# Patient Record
Sex: Male | Born: 1970
Health system: Southern US, Community
[De-identification: ages and names within clinical notes are randomized; demographics above are authoritative.]

## PROBLEM LIST (undated history)

## (undated) DIAGNOSIS — M25429 Effusion, unspecified elbow: Secondary | ICD-10-CM

## (undated) DIAGNOSIS — E785 Hyperlipidemia, unspecified: Secondary | ICD-10-CM

## (undated) DIAGNOSIS — I10 Essential (primary) hypertension: Secondary | ICD-10-CM

## (undated) DIAGNOSIS — G43909 Migraine, unspecified, not intractable, without status migrainosus: Secondary | ICD-10-CM

## (undated) DIAGNOSIS — R51 Headache: Secondary | ICD-10-CM

## (undated) DIAGNOSIS — M109 Gout, unspecified: Secondary | ICD-10-CM

## (undated) DIAGNOSIS — R519 Headache, unspecified: Secondary | ICD-10-CM

## (undated) HISTORY — DX: Headache, unspecified: R51.9

## (undated) HISTORY — DX: Hyperlipidemia, unspecified: E78.5

## (undated) HISTORY — DX: Headache: R51

## (undated) HISTORY — DX: Effusion, unspecified elbow: M25.429

## (undated) HISTORY — PX: TESTICLE TORSION REDUCTION: SHX795

## (undated) HISTORY — DX: Gout, unspecified: M10.9

## (undated) HISTORY — DX: Migraine, unspecified, not intractable, without status migrainosus: G43.909

---

## 2000-06-19 ENCOUNTER — Emergency Department (HOSPITAL_COMMUNITY): Admission: EM | Admit: 2000-06-19 | Discharge: 2000-06-19 | Payer: Self-pay | Admitting: Emergency Medicine

## 2011-12-22 ENCOUNTER — Emergency Department (HOSPITAL_BASED_OUTPATIENT_CLINIC_OR_DEPARTMENT_OTHER)
Admission: EM | Admit: 2011-12-22 | Discharge: 2011-12-22 | Disposition: A | Discharge status: Home / Self Care | Payer: BC Managed Care – PPO | Attending: Emergency Medicine | Admitting: Emergency Medicine

## 2011-12-22 ENCOUNTER — Encounter (HOSPITAL_BASED_OUTPATIENT_CLINIC_OR_DEPARTMENT_OTHER): Payer: Self-pay

## 2011-12-22 ENCOUNTER — Emergency Department (INDEPENDENT_AMBULATORY_CARE_PROVIDER_SITE_OTHER): Payer: BC Managed Care – PPO

## 2011-12-22 ENCOUNTER — Other Ambulatory Visit: Payer: Self-pay

## 2011-12-22 DIAGNOSIS — I10 Essential (primary) hypertension: Secondary | ICD-10-CM

## 2011-12-22 DIAGNOSIS — R51 Headache: Secondary | ICD-10-CM | POA: Insufficient documentation

## 2011-12-22 DIAGNOSIS — R42 Dizziness and giddiness: Secondary | ICD-10-CM | POA: Insufficient documentation

## 2011-12-22 DIAGNOSIS — R030 Elevated blood-pressure reading, without diagnosis of hypertension: Secondary | ICD-10-CM | POA: Insufficient documentation

## 2011-12-22 LAB — BASIC METABOLIC PANEL
BUN: 11 mg/dL (ref 6–23)
Calcium: 10 mg/dL (ref 8.4–10.5)
Creatinine, Ser: 1.2 mg/dL (ref 0.50–1.35)
GFR calc Af Amer: 87 mL/min — ABNORMAL LOW (ref >90)
GFR calc non Af Amer: 75 mL/min — ABNORMAL LOW (ref >90)
Glucose, Bld: 95 mg/dL (ref 70–99)
Potassium: 3.8 mEq/L (ref 3.5–5.1)

## 2011-12-22 LAB — DIFFERENTIAL
Basophils Relative: 0 % (ref 0–1)
Eosinophils Absolute: 0.2 10*3/uL (ref 0.0–0.7)
Eosinophils Relative: 3 % (ref 0–5)
Lymphs Abs: 1.8 10*3/uL (ref 0.7–4.0)
Monocytes Relative: 6 % (ref 3–12)
Neutrophils Relative %: 61 % (ref 43–77)

## 2011-12-22 LAB — CBC
Hemoglobin: 15.9 g/dL (ref 13.0–17.0)
MCH: 29.1 pg (ref 26.0–34.0)
MCHC: 36.4 g/dL — ABNORMAL HIGH (ref 30.0–36.0)
MCV: 79.9 fL (ref 78.0–100.0)

## 2011-12-22 MED ORDER — HYDROCHLOROTHIAZIDE 25 MG PO TABS: 25.0000 mg | ORAL_TABLET | Freq: Every day | ORAL | Status: DC

## 2011-12-22 MED ORDER — MECLIZINE HCL 25 MG PO TABS
25.0000 mg | ORAL_TABLET | Freq: Once | ORAL | Status: AC
  Administered 2011-12-22: 25 mg via ORAL
  Filled 2011-12-22: qty 1

## 2011-12-22 MED ORDER — MECLIZINE HCL 50 MG PO TABS: 50.0000 mg | ORAL_TABLET | Freq: Three times a day (TID) | ORAL | Status: AC | PRN

## 2011-12-22 NOTE — ED Notes (Signed)
Pt reports he has been "light-headed" and dizzy since Saturday morning.  He reports a headache today.

## 2011-12-22 NOTE — ED Notes (Signed)
Pt amb, talking  in hall o2 sat 91% , MD made aware

## 2011-12-22 NOTE — ED Provider Notes (Signed)
History     CSN: [id number]  Arrival date & time 12/22/11  1239   First MD Initiated Contact with Patient 12/22/11 1300      Chief Complaint  Patient presents with  . Dizziness  . Headache    (Consider location/radiation/quality/duration/timing/severity/associated sxs/prior treatment) HPI  Patient presents to the ER complaining of a few week hx of episodic mild lightheadedness that he would notice mostly in the mornings that would improve during the day but states that Saturday (2 days ago) he woke with increased dizziness with feeling of room spinning about that improved during the day with waxing and waning of the lightheadedness. On Sunday, yesterday, patient states he woke again feeling increased dizziness and noticed throughout the day that dizziness worsened when he went from lying to standing or if he bent over or moved quickly. He states that when he woke to go to work today and once again felt poorly, he decided to come to ER for evaluation. He states that on the way to ER he had gradual onset mild left sided headache but denies associated HA with dizziness prior to this morning. Has taken nothing for symptoms PTA. Patient states he has no known medical problems and takes no medicines on a regular basis. He states that he has other wise felt well denying recent illness. He denies fevers, chills, HA (prior to today), visual changes, neck stiffness, CP, SOB, n/v, abdominal pain, extremity numbness/tingling/weakness, slurred speech, increased urination or thirst.   History reviewed. No pertinent past medical history.  History reviewed. No pertinent past surgical history.  No family history on file.  History  Substance Use Topics  . Smoking status: Never Smoker   . Smokeless tobacco: Never Used  . Alcohol Use: Yes     occasional      Review of Systems  All other systems reviewed and are negative.    Allergies  Review of patient's allergies indicates not on file.  Home  Medications  No current outpatient prescriptions on file.  BP 152/98  Pulse 96  Temp(Src) 98 F (36.7 C) (Oral)  Resp 20  Ht 5\' 6"  (1.676 m)  Wt 185 lb (83.915 kg)  BMI 29.86 kg/m2  SpO2 100%  Physical Exam  Nursing note and vitals reviewed. Constitutional: He is oriented to person, place, and time. He appears well-developed and well-nourished. No distress.  HENT:  Head: Normocephalic and atraumatic.  Eyes: Conjunctivae and EOM are normal. Pupils are equal, round, and reactive to light. Right eye exhibits no nystagmus. Left eye exhibits no nystagmus.  Neck: Normal range of motion. Neck supple.  Cardiovascular: Normal rate, regular rhythm, normal heart sounds and intact distal pulses.  Exam reveals no gallop and no friction rub.   No murmur heard. Pulmonary/Chest: Effort normal and breath sounds normal. No respiratory distress. He has no wheezes. He has no rales. He exhibits no tenderness.  Abdominal: Soft. Bowel sounds are normal. He exhibits no distension and no mass. There is no tenderness. There is no rebound and no guarding.  Musculoskeletal: Normal range of motion. He exhibits no edema and no tenderness.  Neurological: He is alert and oriented to person, place, and time. No cranial nerve deficit. Coordination normal.  Skin: Skin is warm and dry. No rash noted. He is not diaphoretic. No erythema.  Psychiatric: He has a normal mood and affect.    ED Course  Procedures (including critical care time)  PO meclizine   Date: 12/22/2011  Rate: 64  Rhythm: normal sinus  rhythm  QRS Axis: normal  Intervals: normal  ST/T Wave abnormalities: normal  Conduction Disutrbances: none  Narrative Interpretation:   Old EKG Reviewed: no EKG for comparison    Labs Reviewed  CBC - Abnormal; Notable for the following:    MCHC 36.4 (*)    All other components within normal limits  BASIC METABOLIC PANEL - Abnormal; Notable for the following:    GFR calc non Af Amer 75 (*)    GFR calc  Af Amer 87 (*)    All other components within normal limits  DIFFERENTIAL   Ct Head Wo Contrast  12/22/2011  *RADIOLOGY REPORT*  Clinical Data: Dizziness, more severe in the morning.  CT HEAD WITHOUT CONTRAST  Technique:  Contiguous axial images were obtained from the base of the skull through the vertex without contrast.  Comparison: None.  Findings: The brain has a normal appearance without evidence of malformation, atrophy, old or acute infarction, mass lesion, hemorrhage, hydrocephalus or extra-axial collection.  No fluid in the visualized sinuses, middle ears or mastoids.  The calvarium is unremarkable.  IMPRESSION: Normal head CT.  Original Report Authenticated By: Thomasenia Sales, M.D.     1. Dizziness   2. Hypertension       MDM  No ataxia or gait abnormality. No nystagmus or suggestion of posterior stroke with no acute findings on labs or head CT. Patient is asymptomatic in ER. No acute findings on EKG but HTN with repeat BP. Will sent out with low dose HTN for start and referral to PCP for ongoing evaluation and management        Jenness Corner, PA 12/22/11 1510

## 2011-12-22 NOTE — ED Provider Notes (Signed)
Medical screening examination/treatment/procedure(s) were conducted as a shared visit with non-physician practitioner(s) and myself.  I personally evaluated the patient during the encounter  Episodic lightheadedness with increased dizziness and "room spinning" in the mornings.  Worse with quick position change.  No nausea, vomiting, visual change.  Neuro intact.  No nystagmus, no ataxia, test of skew and head impulse testing wnl.  Glynn Octave, MD 12/22/11 [phone]

## 2011-12-22 NOTE — Discharge Instructions (Signed)
Start HCTZ for elevated blood pressure and follow up with a primary care provider for ongoing evaluation and management of your blood pressure but also for further evaluation of ongoing dizziness. Take meclizine as directed for dizziness but return to ER for changing or worsening of symptoms.   Dizziness Dizziness is a common problem. It is a feeling of unsteadiness or lightheadedness. You may feel like you are about to faint. Dizziness can lead to injury if you stumble or fall. A person of any age group can suffer from dizziness, but dizziness is more common in older adults. CAUSES  Dizziness can be caused by many different things, including:  Middle ear problems.   Standing for too long.   Infections.   An allergic reaction.   Aging.   An emotional response to something, such as the sight of blood.   Side effects of medicines.   Fatigue.   Problems with circulation or blood pressure.   Excess use of alcohol, medicines, or illegal drug use.   Breathing too fast (hyperventilation).   An arrhythmia or problems with your heart rhythm.   Anemia or a low blood count.   Pregnancy.   Vomiting, diarrhea, fever, or other illnesses that cause dehydration.   Diseases or conditions such as Parkinson's disease, high blood pressure (hypertension), diabetes, and thyroid problems.   Exposure to extreme heat.  DIAGNOSIS  To find the cause of your dizziness, your caregiver may do a physical exam, lab tests, radiologic imaging scans, or an electrocardiography test (ECG).  TREATMENT  Treatment of dizziness depends on the cause of your symptoms and can vary greatly. HOME CARE INSTRUCTIONS   Drink enough fluids to keep your urine clear or pale yellow. This is especially important in very hot weather. In the elderly, it is also important in cold weather.   If your dizziness is caused by medicines, take them exactly as directed. When taking blood pressure medicines, it is especially  important to get up slowly.   Rise slowly from chairs and steady yourself until you feel okay.   In the morning, first sit up on the side of the bed. When this seems okay, stand slowly while holding onto something until you know your balance is fine.   If you need to stand in one place for a long time, be sure to move your legs often. Tighten and relax the muscles in your legs while standing.   If dizziness continues to be a problem, have someone stay with you for a day or two. Do this until you feel you are well enough to stay alone. Have the person call your caregiver if he or she notices changes in you that are concerning.   Do not drive or use heavy machinery if you feel dizzy.  SEEK IMMEDIATE MEDICAL CARE IF:   Your dizziness or lightheadedness gets worse.   You feel nauseous or vomit.   You develop problems with talking, walking, weakness, or using your arms, hands, or legs.   You are not thinking clearly or you have difficulty forming sentences. It may take a friend or family member to determine if your thinking is normal.   You develop chest pain, abdominal pain, shortness of breath, or sweating.   Your vision changes.   You notice any bleeding.   You have side effects from medicine that seems to be getting worse rather than better.  MAKE SURE YOU:   Understand these instructions.   Will watch your condition.   Will  get help right away if you are not doing well or get worse.  Document Released: 04/08/2001 Document Revised: 06/17/2011 Document Reviewed: 05/02/2011 [facility] Patient Information 2012 [place], [place].  Hypertension Hypertension is another name for high blood pressure. High blood pressure may mean that your heart needs to work harder to pump blood. Blood pressure consists of two numbers, which includes a higher number over a lower number (example: 110/72). HOME CARE   Make lifestyle changes as told by your doctor. This may include weight loss and exercise.    Take your blood pressure medicine every day.   Limit how much salt you use.   Stop smoking if you smoke.   Do not use drugs.   Talk to your doctor if you are using decongestants or birth control pills. These medicines might make blood pressure higher.   Females should not drink more than 1 alcoholic drink per day. Males should not drink more than 2 alcoholic drinks per day.   See your doctor as told.  GET HELP RIGHT AWAY IF:   You have a blood pressure reading with a top number of 180 or higher.   You get a very bad headache.   You get blurred or changing vision.   You feel confused.   You feel weak, numb, or faint.   You get chest or belly (abdominal) pain.   You throw up (vomit).   You cannot breathe very well.  MAKE SURE YOU:   Understand these instructions.   Will watch your condition.   Will get help right away if you are not doing well or get worse.  Document Released: 03/31/2008 Document Revised: 06/25/2011 Document Reviewed: 03/31/2008 [facility] Patient Information 2012 [place], [place].

## 2013-10-09 ENCOUNTER — Encounter (HOSPITAL_COMMUNITY): Payer: Self-pay | Admitting: Emergency Medicine

## 2013-10-09 ENCOUNTER — Emergency Department (HOSPITAL_COMMUNITY)
Admission: EM | Admit: 2013-10-09 | Discharge: 2013-10-09 | Disposition: A | Discharge status: Home / Self Care | Payer: BC Managed Care – PPO | Attending: Emergency Medicine | Admitting: Emergency Medicine

## 2013-10-09 ENCOUNTER — Emergency Department (HOSPITAL_COMMUNITY): Payer: BC Managed Care – PPO

## 2013-10-09 DIAGNOSIS — M25421 Effusion, right elbow: Secondary | ICD-10-CM

## 2013-10-09 DIAGNOSIS — G8929 Other chronic pain: Secondary | ICD-10-CM | POA: Insufficient documentation

## 2013-10-09 DIAGNOSIS — M545 Low back pain, unspecified: Secondary | ICD-10-CM | POA: Insufficient documentation

## 2013-10-09 DIAGNOSIS — M25561 Pain in right knee: Secondary | ICD-10-CM

## 2013-10-09 DIAGNOSIS — I10 Essential (primary) hypertension: Secondary | ICD-10-CM | POA: Insufficient documentation

## 2013-10-09 DIAGNOSIS — M549 Dorsalgia, unspecified: Secondary | ICD-10-CM

## 2013-10-09 DIAGNOSIS — M25569 Pain in unspecified knee: Secondary | ICD-10-CM | POA: Insufficient documentation

## 2013-10-09 DIAGNOSIS — M25429 Effusion, unspecified elbow: Secondary | ICD-10-CM | POA: Insufficient documentation

## 2013-10-09 DIAGNOSIS — R209 Unspecified disturbances of skin sensation: Secondary | ICD-10-CM | POA: Insufficient documentation

## 2013-10-09 HISTORY — DX: Essential (primary) hypertension: I10

## 2013-10-09 MED ORDER — TRAMADOL HCL 50 MG PO TABS: 50.0000 mg | ORAL_TABLET | Freq: Four times a day (QID) | ORAL | Status: DC | PRN

## 2013-10-09 MED ORDER — DIAZEPAM 5 MG PO TABS: 5.0000 mg | ORAL_TABLET | Freq: Two times a day (BID) | ORAL | Status: DC

## 2013-10-09 NOTE — ED Notes (Signed)
Pt c/o right knee and arm pain also low back pain x 1 month. 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 Pt denies any other possible injuries. Pt ambulatory in triage.

## 2013-10-09 NOTE — Progress Notes (Signed)
Orthopedic Tech Progress Note Patient Details:  Theodore Maldonado 12/12/1970 [id number]  Ortho Devices Type of Ortho Device: Arm sling Ortho Device/Splint Interventions: Application   Cammer, Mickie Bail 10/09/2013, 1:53 PM

## 2013-10-09 NOTE — ED Notes (Signed)
Ortho given per pt request. Pt did not want applied states will do at home. Instruction given per ortho.

## 2013-10-09 NOTE — ED Provider Notes (Signed)
CSN: [id number]     Arrival date & time 10/09/13  1151 History   First MD Initiated Contact with Patient 10/09/13 1158    This chart was scribed for Irish Elders NP, a non-physician practitioner working with Junius Argyle, MD by Lewanda Rife, ED Scribe. This patient was seen in room TR07C/TR07C and the patient's care was started at 12:08 PM     Chief Complaint  Patient presents with  . Knee Pain    right knee pain  . Arm Pain    right arm pain  . Back Pain    Low back   (Consider location/radiation/quality/duration/timing/severity/associated sxs/prior Treatment) The history is provided by the patient. No language interpreter was used.   HPI Comments: Theodore Maldonado is a 42 y.o. male who presents to the Emergency Department complaining of waxing and waning worsening low back pain, right arm pain, and right knee pain onset chronic for the last month. 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 Describes pain as shooting. Reports associated occasional numbness of distal right 2nd and 3rd digits. Reports pain is exacerbated with movement and alleviated by nothing. Denies associated recent trauma, fever, dysuria, burning with urination, abdominal pain, and weakness. Denies pertinent PMHx.  Past Medical History  Diagnosis Date  . Hypertension    History reviewed. No pertinent past surgical history. No family history on file. History  Substance Use Topics  . Smoking status: Never Smoker   . Smokeless tobacco: Never Used  . Alcohol Use: Yes     Comment: occasional    Review of Systems  Constitutional: Negative for fever.  Musculoskeletal: Positive for arthralgias, back pain and myalgias.  Psychiatric/Behavioral: Negative for confusion.   A complete 10 system review of systems was obtained and all systems are negative except as noted in the HPI and PMHx.    Allergies  Review of patient's allergies indicates no known allergies.  Home Medications    Current Outpatient Rx  Name  Route  Sig  Dispense  Refill  . EXPIRED: hydrochlorothiazide (HYDRODIURIL) 25 MG tablet   Oral   Take 1 tablet (25 mg total) by mouth daily.   30 tablet   0    BP 161/102  Pulse 71  Temp(Src) 97.9 F (36.6 C) (Oral)  Resp 20  Ht 5\' 6"  (1.676 m)  Wt 199 lb 9 oz (90.521 kg)  BMI 32.23 kg/m2  SpO2 99% Physical Exam  Nursing note and vitals reviewed. Constitutional: He is oriented to person, place, and time. He appears well-developed and well-nourished. No distress.  HENT:  Head: Normocephalic and atraumatic.  Eyes: EOM are normal.  Neck: Neck supple. No tracheal deviation present.  Cardiovascular: Normal rate, regular rhythm, intact distal pulses and normal pulses.   Pulses:      Radial pulses are 2+ on the right side, and 2+ on the left side.  Pulmonary/Chest: Effort normal and breath sounds normal. No respiratory distress.  Musculoskeletal: He exhibits no tenderness.       Right elbow: He exhibits normal range of motion. No tenderness found. No radial head, no medial epicondyle, no lateral epicondyle and no olecranon process tenderness noted.       Right knee: He exhibits normal range of motion, no swelling and no deformity. No tenderness found. No medial joint line and no lateral joint line tenderness noted.  No midline C-spine, T-spine, or L-spine tenderness with no step-offs or deformities noted   Right elbow: pain with flexion and medial  rotation of elbow   Cap refill < 2 seconds   Right knee: normal flexion and extension  Neurological: He is alert and oriented to person, place, and time. No sensory deficit.  Skin: Skin is warm and dry.  Psychiatric: He has a normal mood and affect. His behavior is normal.    ED Course  Procedures (including critical care time)  COORDINATION OF CARE:  Nursing notes reviewed. Vital signs reviewed. Initial pt interview and examination performed.   12:14 PM-Discussed work up plan with pt at bedside,  which includes x-ray of right knee, and x-ray of right elbow. Pt agrees with plan.   Treatment plan initiated:Medications - No data to display   Initial diagnostic testing ordered.    Labs Review Labs Reviewed - No data to display Imaging Review No results found.  EKG Interpretation   None       MDM   1. Elbow effusion, right   2. Back pain   3. 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 Right elbow; joint effusion, no fracture seen. Negative right knee x-ray. Sling for elbow, RICE instructions explained. No heavy lifting with right arm. Follow-up x-ray in 3-4 weeks. Pt agrees to plan.  I personally performed the services described in this documentation, which was scribed in my presence. The recorded information has been reviewed and is accurate.    Irish Elders, NP 10/14/13 [phone]

## 2013-10-17 NOTE — ED Provider Notes (Signed)
Medical screening examination/treatment/procedure(s) were performed by non-physician practitioner and as supervising physician I was immediately available for consultation/collaboration.  EKG Interpretation   None         Junius Argyle, MD 10/17/13 1700

## 2013-12-02 ENCOUNTER — Encounter (HOSPITAL_COMMUNITY): Payer: Self-pay | Admitting: Emergency Medicine

## 2013-12-02 ENCOUNTER — Emergency Department (HOSPITAL_COMMUNITY)
Admission: EM | Admit: 2013-12-02 | Discharge: 2013-12-02 | Disposition: A | Discharge status: Home / Self Care | Payer: BC Managed Care – PPO | Attending: Emergency Medicine | Admitting: Emergency Medicine

## 2013-12-02 ENCOUNTER — Emergency Department (HOSPITAL_COMMUNITY): Payer: BC Managed Care – PPO

## 2013-12-02 DIAGNOSIS — Y9289 Other specified places as the place of occurrence of the external cause: Secondary | ICD-10-CM | POA: Insufficient documentation

## 2013-12-02 DIAGNOSIS — S59909A Unspecified injury of unspecified elbow, initial encounter: Secondary | ICD-10-CM | POA: Insufficient documentation

## 2013-12-02 DIAGNOSIS — S6990XA Unspecified injury of unspecified wrist, hand and finger(s), initial encounter: Secondary | ICD-10-CM | POA: Insufficient documentation

## 2013-12-02 DIAGNOSIS — R296 Repeated falls: Secondary | ICD-10-CM | POA: Insufficient documentation

## 2013-12-02 DIAGNOSIS — I10 Essential (primary) hypertension: Secondary | ICD-10-CM | POA: Insufficient documentation

## 2013-12-02 DIAGNOSIS — Y990 Civilian activity done for income or pay: Secondary | ICD-10-CM | POA: Insufficient documentation

## 2013-12-02 DIAGNOSIS — Z79899 Other long term (current) drug therapy: Secondary | ICD-10-CM | POA: Insufficient documentation

## 2013-12-02 DIAGNOSIS — S59919A Unspecified injury of unspecified forearm, initial encounter: Principal | ICD-10-CM

## 2013-12-02 DIAGNOSIS — Y939 Activity, unspecified: Secondary | ICD-10-CM | POA: Insufficient documentation

## 2013-12-02 MED ORDER — OXYCODONE-ACETAMINOPHEN 5-325 MG PO TABS
2.0000 | ORAL_TABLET | Freq: Once | ORAL | Status: AC
  Administered 2013-12-02: 2 via ORAL
  Filled 2013-12-02: qty 2

## 2013-12-02 MED ORDER — ONDANSETRON 4 MG PO TBDP
4.0000 mg | ORAL_TABLET | Freq: Once | ORAL | Status: AC
  Administered 2013-12-02: 4 mg via ORAL
  Filled 2013-12-02: qty 1

## 2013-12-02 MED ORDER — MELOXICAM 15 MG PO TABS: 15.0000 mg | ORAL_TABLET | Freq: Every day | ORAL | Status: DC

## 2013-12-02 MED ORDER — HYDROCODONE-ACETAMINOPHEN 5-325 MG PO TABS: 1.0000 | ORAL_TABLET | ORAL | Status: DC | PRN

## 2013-12-02 NOTE — Discharge Instructions (Signed)
Do not drive, operate heavy machinery, drink alcohol,  with this medicine. Do not take any over the counter medications with this medicine. 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 Please follow up with Dr. Rip Harbour. Please see the attached paperwork.

## 2013-12-02 NOTE — ED Provider Notes (Signed)
Medical screening examination/treatment/procedure(s) were performed by non-physician practitioner and as supervising physician I was immediately available for consultation/collaboration.     Veryl Speak, MD 12/02/13 [phone]

## 2013-12-02 NOTE — ED Notes (Signed)
Pt c/o R wrist injury and pain after falling at work x 2 days ago.  Pain score 10/10.  Denies numbness and tingling.  Swelling noted.

## 2013-12-02 NOTE — ED Provider Notes (Signed)
CSN: [id number]     Arrival date & time 12/02/13  1032 History   First MD Initiated Contact with Patient 12/02/13 1050     Chief Complaint  Patient presents with  . Wrist Pain   (Consider location/radiation/quality/duration/timing/severity/associated sxs/prior Treatment) HPI Comments:  2 days ago in the evening the patient fell at work onto an outstretched hand on the right side.  He had immediate pain in the wrist.  Patient states that yesterday he had throbbing pain but tried very hard not to use his wrist at all.  Today the patient awoke with severe pain, throbbing, swelling, inability to use the wrist.  He is concerned for possible fracture.  Patient is a 43 y.o. male presenting with wrist pain. The history is provided by the patient.  Wrist Pain The current episode started in the past 7 days. The problem has been gradually worsening. Associated symptoms include joint swelling. Pertinent negatives include no abdominal pain, anorexia, arthralgias, change in bowel habit, chest pain, chills, congestion, coughing, diaphoresis, fatigue, fever, headaches, myalgias, nausea, neck pain, numbness, rash, sore throat, swollen glands, urinary symptoms, vertigo, visual change, vomiting or weakness. The symptoms are aggravated by bending. He has tried nothing for the symptoms.    Past Medical History  Diagnosis Date  . Hypertension    History reviewed. No pertinent past surgical history. No family history on file. History  Substance Use Topics  . Smoking status: Never Smoker   . Smokeless tobacco: Never Used  . Alcohol Use: Yes     Comment: occasional    Review of Systems  Constitutional: Negative for fever, chills, diaphoresis and fatigue.  HENT: Negative for congestion and sore throat.   Respiratory: Negative for cough.   Cardiovascular: Negative for chest pain.  Gastrointestinal: Negative for nausea, vomiting, abdominal pain, anorexia and change in bowel habit.  Musculoskeletal: Positive  for joint swelling. Negative for arthralgias, myalgias and neck pain.  Skin: Negative for rash.  Neurological: Negative for vertigo, weakness, numbness and headaches.    Allergies  Review of patient's allergies indicates no known allergies.  Home Medications   Current Outpatient Rx  Name  Route  Sig  Dispense  Refill  . hydrochlorothiazide (HYDRODIURIL) 25 MG tablet   Oral   Take 25 mg by mouth every morning.          Marland Kitchen ibuprofen (ADVIL,MOTRIN) 200 MG tablet   Oral   Take 600 mg by mouth every 6 (six) hours as needed for moderate pain.         Marland Kitchen losartan (COZAAR) 25 MG tablet   Oral   Take 1 tablet by mouth every morning.          BP 143/82  Pulse 80  Temp(Src) 97.5 F (36.4 C) (Oral)  Resp 17  SpO2 98% Physical Exam  Nursing note and vitals reviewed. Constitutional: He appears well-developed and well-nourished. No distress.  HENT:  Head: Normocephalic and atraumatic.  Eyes: Conjunctivae are normal. No scleral icterus.  Neck: Normal range of motion. Neck supple.  Cardiovascular: Normal rate, regular rhythm and normal heart sounds.   Pulmonary/Chest: Effort normal and breath sounds normal. No respiratory distress.  Abdominal: Soft. There is no tenderness.  Musculoskeletal: He exhibits no edema.  Musculoskeletal exam: abnormal exam of right Swelling and tenderness of the R wrist TTP over distal radius. No scaphoid tenderness.  ROM limited. N/V intact.   Neurological: He is alert.  Skin: Skin is warm and dry. He is not diaphoretic.  Psychiatric: His  behavior is normal.    ED Course  Procedures (including critical care time) Labs Review Labs Reviewed - No data to display Imaging Review Dg Wrist Complete Right  12/02/2013   CLINICAL DATA:  Persistent pain and swelling, fell 3 days ago  EXAM: RIGHT WRIST - COMPLETE 3+ VIEW  COMPARISON:  None.  FINDINGS: Soft tissue swelling about the wrist dorsally. No evidence of acute fracture or malalignment. There is mild  widening of the scapholunate interspace. Normal bony mineralization. No lytic or blastic osseous lesion.  IMPRESSION: 1. Mild widening of the scapholunate interspace. Cannot exclude scapholunate ligament injury. 2. No acute fracture identified. 3. Soft tissue swelling over the dorsal aspect of the wrist.   Electronically Signed   By: Jacqulynn Cadet M.D.   On: 12/02/2013 12:36    EKG Interpretation   None       MDM   1. Wrist injury    Patient with significant pain.  X-ray is without rash or dislocation however the patient does have some widening of the scapholunate joint which may show some ligamentous disruption. The patient sees Dr. Rhina Brackett at [place] for orthopedics and will make an appointment to see him this week.  Pain medication and anti-inflammatory medications.   The patient appears reasonably screened and/or stabilized for discharge and I doubt any other medical condition or other [facility] requiring further screening, evaluation, or treatment in the ED at this time prior to discharge.   Margarita Mail, PA-C 12/02/13 1258

## 2014-02-20 ENCOUNTER — Ambulatory Visit: Payer: BC Managed Care – PPO | Admitting: Physician Assistant

## 2014-02-22 ENCOUNTER — Ambulatory Visit: Payer: BC Managed Care – PPO | Admitting: Nurse Practitioner

## 2014-02-22 ENCOUNTER — Ambulatory Visit (INDEPENDENT_AMBULATORY_CARE_PROVIDER_SITE_OTHER): Payer: BC Managed Care – PPO | Admitting: Nurse Practitioner

## 2014-02-22 ENCOUNTER — Encounter: Payer: Self-pay | Admitting: Nurse Practitioner

## 2014-02-22 VITALS — BP 140/94 | HR 76 | Temp 97.4°F | Ht 65.5 in | Wt 209.0 lb

## 2014-02-22 DIAGNOSIS — M25469 Effusion, unspecified knee: Secondary | ICD-10-CM

## 2014-02-22 DIAGNOSIS — M25462 Effusion, left knee: Principal | ICD-10-CM

## 2014-02-22 DIAGNOSIS — I10 Essential (primary) hypertension: Secondary | ICD-10-CM | POA: Insufficient documentation

## 2014-02-22 DIAGNOSIS — M25461 Effusion, right knee: Secondary | ICD-10-CM | POA: Insufficient documentation

## 2014-02-22 LAB — CBC
HEMATOCRIT: 45 % (ref 39.0–52.0)
HEMOGLOBIN: 15.2 g/dL (ref 13.0–17.0)
MCHC: 33.8 g/dL (ref 30.0–36.0)
MCV: 87.3 fl (ref 78.0–100.0)
Platelets: 221 10*3/uL (ref 150.0–400.0)
RBC: 5.16 Mil/uL (ref 4.22–5.81)
RDW: 13.9 % (ref 11.5–14.6)
WBC: 5.3 10*3/uL (ref 4.5–10.5)

## 2014-02-22 LAB — COMPREHENSIVE METABOLIC PANEL
ALT: 51 U/L (ref 0–53)
AST: 39 U/L — ABNORMAL HIGH (ref 0–37)
Albumin: 4.1 g/dL (ref 3.5–5.2)
Alkaline Phosphatase: 58 U/L (ref 39–117)
BILIRUBIN TOTAL: 0.3 mg/dL (ref 0.3–1.2)
BUN: 18 mg/dL (ref 6–23)
CO2: 28 meq/L (ref 19–32)
CREATININE: 1.1 mg/dL (ref 0.4–1.5)
Calcium: 9.6 mg/dL (ref 8.4–10.5)
Chloride: 103 mEq/L (ref 96–112)
GFR: 96.61 mL/min (ref >60.00)
GLUCOSE: 86 mg/dL (ref 70–99)
Potassium: 4.5 mEq/L (ref 3.5–5.1)
Sodium: 136 mEq/L (ref 135–145)
Total Protein: 7.7 g/dL (ref 6.0–8.3)

## 2014-02-22 LAB — MICROALBUMIN / CREATININE URINE RATIO
Creatinine,U: 92.9 mg/dL
MICROALB UR: 15 mg/dL — AB (ref 0.0–1.9)
Microalb Creat Ratio: 16.2 mg/g (ref 0.0–30.0)

## 2014-02-22 LAB — SEDIMENTATION RATE: Sed Rate: 20 mm/hr (ref 0–22)

## 2014-02-22 LAB — LIPASE: LIPASE: 20 U/L (ref 11.0–59.0)

## 2014-02-22 LAB — URIC ACID: URIC ACID, SERUM: 5.7 mg/dL (ref 4.0–7.8)

## 2014-02-22 LAB — HEMOGLOBIN A1C: Hgb A1c MFr Bld: 5.1 % (ref 4.6–6.5)

## 2014-02-22 LAB — TSH: TSH: 1.34 u[IU]/mL (ref 0.35–5.50)

## 2014-02-22 MED ORDER — LOSARTAN POTASSIUM 50 MG PO TABS: 50.0000 mg | ORAL_TABLET | Freq: Every morning | ORAL | Status: DC

## 2014-02-22 NOTE — Patient Instructions (Signed)
Ice knee several times this evening. No long walks for 2 days. OK to use heat on knees after 48 hours.  My office will contact you regarding all labs work. Start new blood pressure med dose.  Restrict salt to 2400 mg daily. Read about DASH diet. Take 30 minute walk daily. Return in 2 weeks to evaluate blood pressure response and knees.   Hypertension As your heart beats, it forces blood through your arteries. This force is your blood pressure. If the pressure is too high, it is called hypertension (HTN) or high blood pressure. HTN is dangerous because you may have it and not know it. High blood pressure may mean that your heart has to work harder to pump blood. Your arteries may be narrow or stiff. The extra work puts you at risk for heart disease, stroke, and other problems.  Blood pressure consists of two numbers, a higher number over a lower, 110/72, for example. It is stated as "110 over 72." The ideal is below 120 for the top number (systolic) and under 80 for the bottom (diastolic). Write down your blood pressure today. You should pay close attention to your blood pressure if you have certain conditions such as:  Heart failure.  Prior heart attack.  Diabetes  Chronic kidney disease.  Prior stroke.  Multiple risk factors for heart disease. To see if you have HTN, your blood pressure should be measured while you are seated with your arm held at the level of the heart. It should be measured at least twice. A one-time elevated blood pressure reading (especially in the Emergency Department) does not mean that you need treatment. There may be conditions in which the blood pressure is different between your right and left arms. It is important to see your caregiver soon for a recheck. Most people have essential hypertension which means that there is not a specific cause. This type of high blood pressure may be lowered by changing lifestyle factors such as:  Stress.  Smoking.  Lack of  exercise.  Excessive weight.  Drug/tobacco/alcohol use.  Eating less salt. Most people do not have symptoms from high blood pressure until it has caused damage to the body. Effective treatment can often prevent, delay or reduce that damage. TREATMENT  When a cause has been identified, treatment for high blood pressure is directed at the cause. There are a large number of medications to treat HTN. These fall into several categories, and your caregiver will help you select the medicines that are best for you. Medications may have side effects. You should review side effects with your caregiver. If your blood pressure stays high after you have made lifestyle changes or started on medicines,   Your medication(s) may need to be changed.  Other problems may need to be addressed.  Be certain you understand your prescriptions, and know how and when to take your medicine.  Be sure to follow up with your caregiver within the time frame advised (usually within two weeks) to have your blood pressure rechecked and to review your medications.  If you are taking more than one medicine to lower your blood pressure, make sure you know how and at what times they should be taken. Taking two medicines at the same time can result in blood pressure that is too low. SEEK IMMEDIATE MEDICAL CARE IF:  You develop a severe headache, blurred or changing vision, or confusion.  You have unusual weakness or numbness, or a faint feeling.  You have severe chest  or abdominal pain, vomiting, or breathing problems. MAKE SURE YOU:   Understand these instructions.  Will watch your condition.  Will get help right away if you are not doing well or get worse. Document Released: 10/13/2005 Document Revised: 01/05/2012 Document Reviewed: 06/02/2008 [facility] Patient Information 2014 [place].  DASH Diet The DASH diet stands for "Dietary Approaches to Stop Hypertension." It is a healthy eating plan that has been  shown to reduce high blood pressure (hypertension) in as little as 14 days, while also possibly providing other significant health benefits. These other health benefits include reducing the risk of breast cancer after menopause and reducing the risk of type 2 diabetes, heart disease, colon cancer, and stroke. Health benefits also include weight loss and slowing kidney failure in patients with chronic kidney disease.  DIET GUIDELINES  Limit salt (sodium). Your diet should contain less than 1500 mg of sodium daily.  Limit refined or processed carbohydrates. Your diet should include mostly whole grains. Desserts and added sugars should be used sparingly.  Include small amounts of heart-healthy fats. These types of fats include nuts, oils, and tub margarine. Limit saturated and trans fats. These fats have been shown to be harmful in the body. CHOOSING FOODS  The following food groups are based on a 2000 calorie diet. See your Registered Dietitian for individual calorie needs. Grains and Grain Products (6 to 8 servings daily)  Eat More Often: Whole-wheat bread, brown rice, whole-grain or wheat pasta, quinoa, popcorn without added fat or salt (air popped).  Eat Less Often: White bread, white pasta, white rice, cornbread. Vegetables (4 to 5 servings daily)  Eat More Often: Fresh, frozen, and canned vegetables. Vegetables may be raw, steamed, roasted, or grilled with a minimal amount of fat.  Eat Less Often/Avoid: Creamed or fried vegetables. Vegetables in a cheese sauce. Fruit (4 to 5 servings daily)  Eat More Often: All fresh, canned (in natural juice), or frozen fruits. Dried fruits without added sugar. One hundred percent fruit juice ( cup [237 mL] daily).  Eat Less Often: Dried fruits with added sugar. Canned fruit in light or heavy syrup. [employer], Fish, and Poultry (2 servings or less daily. One serving is 3 to 4 oz [85-114 g]).  Eat More Often: Ninety percent or leaner ground beef,  tenderloin, sirloin. Round cuts of beef, chicken breast, [place] breast. All fish. Grill, bake, or broil your meat. Nothing should be fried.  Eat Less Often/Avoid: Fatty cuts of meat, [place], or chicken leg, thigh, or wing. Fried cuts of meat or fish. Dairy (2 to 3 servings)  Eat More Often: Low-fat or fat-free milk, low-fat plain or light yogurt, reduced-fat or part-skim cheese.  Eat Less Often/Avoid: Milk (whole, 2%).Whole milk yogurt. Full-fat cheeses. Nuts, Seeds, and Legumes (4 to 5 servings per week)  Eat More Often: All without added salt.  Eat Less Often/Avoid: Salted nuts and seeds, canned beans with added salt. Fats and Sweets (limited)  Eat More Often: Vegetable oils, tub margarines without trans fats, sugar-free gelatin. Mayonnaise and salad dressings.  Eat Less Often/Avoid: Coconut oils, palm oils, butter, stick margarine, cream, half and half, cookies, candy, pie. FOR MORE INFORMATION The Dash Diet Eating Plan: www.dashdiet.org Document Released: 10/02/2011 Document Revised: 01/05/2012 Document Reviewed: 10/02/2011 [facility] Patient Information 2014 [place], [place].

## 2014-02-22 NOTE — Progress Notes (Signed)
Subjective:     Theodore Maldonado is a 43 y.o. male who presents to establish care. He c/o bilateral knee pain & swelling. Symptoms started 6 mos ago and have been intermittent. He had appt. W/orthopedist few mos ago. No xrays, was prescribed mobic-mild relief. He gives Hx gout in great toe several years ago and recent R elbow effusion. Associated symptoms: back pain-worse in am for about 1 year, dry eyes-frequent tearing, vision cha nge in last few mos, Denies skin rashes, diarrhea, remote or recent STI. Also c/o frequent HA-goes to bed w/HA, wakes w/HA. Not relieved w/ibuprophen or BP med. Denies SOB, cough, palpitations, LE swelling.  The following portions of the patient's history were reviewed and updated as appropriate: allergies, current medications, past family history, past medical history, past social history, past surgical history and problem list.  Review of Systems Pertinent items are noted in HPI.    Objective:    BP 140/94  Pulse 76  Temp(Src) 97.4 F (36.3 C) (Temporal)  Ht 5' 5.5" (1.664 m)  Wt 209 lb (94.802 kg)  BMI 34.24 kg/m2  SpO2 97% BP 140/94  Pulse 76  Temp(Src) 97.4 F (36.3 C) (Temporal)  Ht 5' 5.5" (1.664 m)  Wt 209 lb (94.802 kg)  BMI 34.24 kg/m2  SpO2 97% General appearance: alert, cooperative, appears stated age and mild distress Head: Normocephalic, without obvious abnormality, atraumatic Eyes: negative findings: lids and lashes normal and conjunctivae and sclerae normal Lungs: clear to auscultation bilaterally Heart: regular rate and rhythm, S1, S2 normal, no murmur, click, rub or gallop Extremities: no edema, redness or tenderness in the calves or thighs and bilat effusions & warmth in knees L>R. No joint line tenderness. No obvious Baker's cyst. See procedure note: clear viscous yellow fluid aspitated from L knee.   Procedure note:L knee intraarticular aspiration & injection. R knee intraarticular injection: Verbal consent obtained for  intraarticular injection. Potential complications explained including pain, infection. Knee joints accessed from medial aspect in straight leg position using sterile technique. Each area cleansed with 3 betadine swabs. Ethyl chloride spray used for anesthesia until skin blanched. R knee Joint space accessed using 25 ga 1 1/2 " needle. No blood or fluid return on aspiration. 2 ml 1% lidocaine and 40 mg depo-medrol injected into joint space. Withdrew needle. Pt tolerated procedure with minimal pain. Pressure held at site for 1 minute. No bleeding or hematoma noted. Cleaned betadine off skin. Covered site with band-aid.   L knee joint accessed with 18 ga 1 1/2" needle. Injected .5 ml 1% lidocaine into joint space. Aspirated 30 ml clear Yellow fluid. Syringe changed. Injected 2 ml 1% lidocaine and 40 mg depo-medrol into joint space. Withdrew needle. Pt tolerated procedure with minimal pain. Pressure held at site for 1 minute. No bleeding or hematoma noted. Cleaned betadine off skin. Covered site with band-aid.     Assessment:     1. Bilateral knee effusions   2. Essential hypertension, benign        Plan:   See problem list for complete A&P

## 2014-02-22 NOTE — Progress Notes (Signed)
Pre visit review using our clinic review tool, if applicable. No additional management support is needed unless otherwise documented below in the visit note. 

## 2014-02-22 NOTE — Assessment & Plan Note (Addendum)
Bilat knee effusion. L greater than R. Aspirated 30 mls from L knee, clear viscous, yellow. Followed w/ steroid inj: 40 depomedrol & 2 mls 1 % lidocaine. R knee: No aspirate. Injected 40 mg depomedrol & 2 mls 1 % lidocaine.  Ice for 24h, no long walks for 48 h, Heat after 48 h. Wear knee sleeve on L knee when up & about. Synovial fluid sent for crystals, cell cnt, & culture. CBC, uric acid, ESR, ANA, RF F/u 2 weeks.

## 2014-02-22 NOTE — Assessment & Plan Note (Signed)
Poor control. Frequent HA. Taking 600 mg ibuprophen 3/week. Increase cozaar to 50 mg qd. CMET, CBC, urine microalb, TSH, lipids today. Salt restriciton. DASH diet. Daily 30 minute walk. Stop ibuprophen, use tylenol for HA. F/u 2 weeks.

## 2014-02-23 ENCOUNTER — Telehealth: Payer: Self-pay | Admitting: *Deleted

## 2014-02-23 ENCOUNTER — Ambulatory Visit: Payer: BC Managed Care – PPO

## 2014-02-23 DIAGNOSIS — I10 Essential (primary) hypertension: Secondary | ICD-10-CM

## 2014-02-23 LAB — SYNOVIAL CELL COUNT + DIFF, W/ CRYSTALS
CRYSTALS FLUID: NONE SEEN
Eosinophils-Synovial: 0 % (ref 0–1)
LYMPHOCYTES-SYNOVIAL FLD: 25 % — AB (ref 0–20)
MONOCYTE/MACROPHAGE: 50 % (ref 50–90)
Neutrophil, Synovial: 25 % (ref 0–25)
WBC, Synovial: 450 cu mm — ABNORMAL HIGH (ref 0–200)

## 2014-02-23 LAB — LIPID PANEL
CHOL/HDL RATIO: 3
CHOLESTEROL: 179 mg/dL (ref 0–200)
HDL: 53.2 mg/dL (ref >39.00)
LDL CALC: 114 mg/dL — AB (ref 0–99)
Triglycerides: 60 mg/dL (ref 0.0–149.0)
VLDL: 12 mg/dL (ref 0.0–40.0)

## 2014-02-23 NOTE — Telephone Encounter (Signed)
Request form faxed over to [facility].

## 2014-02-23 NOTE — Telephone Encounter (Signed)
Message copied by Julieta Bellini on Thu Feb 23, 2014  9:23 AM ------      Message from: WEAVER, LAYNE COX      Created: Wed Feb 22, 2014 11:35 PM       pls call lab that resulted lipase. I ordered lipase in error, meant to order lipids. Can they add lipids and credit for lipase? ------

## 2014-02-27 NOTE — Telephone Encounter (Signed)
Labs nml except mild elevation AST & ESR. Synovial fluid has no crystals but elevated WBC. Will refer to rheum. LM for pt to call.

## 2014-02-28 ENCOUNTER — Telehealth: Payer: Self-pay | Admitting: *Deleted

## 2014-02-28 DIAGNOSIS — M25461 Effusion, right knee: Secondary | ICD-10-CM

## 2014-02-28 DIAGNOSIS — M25462 Effusion, left knee: Secondary | ICD-10-CM

## 2014-02-28 DIAGNOSIS — M130 Polyarthritis, unspecified: Secondary | ICD-10-CM

## 2014-02-28 NOTE — Telephone Encounter (Signed)
Labs NML except mildly elevated AST, & elevated microalbumin. RF NEG, Sed rate 20, ANA by IFA Neg. Synovial Fluid analysis shows elevated WBC, no crystals. Pt denies tick bite, STI within last 3 mos. Will refer to rheum. Discussed all w/pt. Answered all questions.

## 2014-02-28 NOTE — Telephone Encounter (Signed)
Pt was returning your call.

## 2014-03-08 ENCOUNTER — Ambulatory Visit: Payer: BC Managed Care – PPO | Admitting: Nurse Practitioner

## 2014-03-08 ENCOUNTER — Encounter: Payer: Self-pay | Admitting: Nurse Practitioner

## 2014-03-08 ENCOUNTER — Ambulatory Visit (INDEPENDENT_AMBULATORY_CARE_PROVIDER_SITE_OTHER): Payer: BC Managed Care – PPO | Admitting: Nurse Practitioner

## 2014-03-08 VITALS — BP 120/88 | HR 78 | Temp 97.9°F | Ht 65.5 in | Wt 204.0 lb

## 2014-03-08 DIAGNOSIS — R748 Abnormal levels of other serum enzymes: Secondary | ICD-10-CM | POA: Insufficient documentation

## 2014-03-08 DIAGNOSIS — M25462 Effusion, left knee: Secondary | ICD-10-CM

## 2014-03-08 DIAGNOSIS — M25469 Effusion, unspecified knee: Secondary | ICD-10-CM

## 2014-03-08 DIAGNOSIS — M25461 Effusion, right knee: Secondary | ICD-10-CM

## 2014-03-08 DIAGNOSIS — I10 Essential (primary) hypertension: Secondary | ICD-10-CM

## 2014-03-08 MED ORDER — LOSARTAN POTASSIUM 50 MG PO TABS: 50.0000 mg | ORAL_TABLET | Freq: Every morning | ORAL | Status: DC

## 2014-03-08 NOTE — Progress Notes (Signed)
Pre visit review using our clinic review tool, if applicable. No additional management support is needed unless otherwise documented below in the visit note. 

## 2014-03-08 NOTE — Patient Instructions (Signed)
Great blood pressure! Glad you are feeling better. Continue to keep track of salt intake -2400 mg daily.  Use aspercream on knee-quarter-sized amount front, back, & sides 3 times daily. Wear knee sleeve when up & about. Ice several times daily as able. Keep appointment with Dr Trudie Reed to further evaluate knee swelling. Nice to see you!  Managing Your High Blood Pressure Blood pressure is a measurement of how forceful your blood is pressing against the walls of the arteries. Arteries are muscular tubes within the circulatory system. Blood pressure does not stay the same. Blood pressure rises when you are active, excited, or nervous; and it lowers during sleep and relaxation. If the numbers measuring your blood pressure stay above normal most of the time, you are at risk for health problems. High blood pressure (hypertension) is a long-term (chronic) condition in which blood pressure is elevated. A blood pressure reading is recorded as two numbers, such as 120 over 80 (or 120/80). The first, higher number is called the systolic pressure. It is a measure of the pressure in your arteries as the heart beats. The second, lower number is called the diastolic pressure. It is a measure of the pressure in your arteries as the heart relaxes between beats.  Keeping your blood pressure in a normal range is important to your overall health and prevention of health problems, such as heart disease and stroke. When your blood pressure is uncontrolled, your heart has to work harder than normal. High blood pressure is a very common condition in adults because blood pressure tends to rise with age. Men and women are equally likely to have hypertension but at different times in life. Before age 64, men are more likely to have hypertension. After 42 years of age, women are more likely to have it. Hypertension is especially common in African Americans. This condition often has no signs or symptoms. The cause of the condition is  usually not known. Your caregiver can help you come up with a plan to keep your blood pressure in a normal, healthy range. BLOOD PRESSURE STAGES Blood pressure is classified into four stages: normal, prehypertension, stage 1, and stage 2. Your blood pressure reading will be used to determine what type of treatment, if any, is necessary. Appropriate treatment options are tied to these four stages:  Normal  Systolic pressure (mm Hg): below 120.  Diastolic pressure (mm Hg): below 80. Prehypertension  Systolic pressure (mm Hg): 120 to 139.  Diastolic pressure (mm Hg): 80 to 89. Stage1  Systolic pressure (mm Hg): 140 to 159.  Diastolic pressure (mm Hg): 90 to 99. Stage2  Systolic pressure (mm Hg): 160 or above.  Diastolic pressure (mm Hg): 100 or above. RISKS RELATED TO HIGH BLOOD PRESSURE Managing your blood pressure is an important responsibility. Uncontrolled high blood pressure can lead to:  A heart attack.  A stroke.  A weakened blood vessel (aneurysm).  Heart failure.  Kidney damage.  Eye damage.  Metabolic syndrome.  Memory and concentration problems. HOW TO MANAGE YOUR BLOOD PRESSURE Blood pressure can be managed effectively with lifestyle changes and medicines (if needed). Your caregiver will help you come up with a plan to bring your blood pressure within a normal range. Your plan should include the following: Education  Read all information provided by your caregivers about how to control blood pressure.  Educate yourself on the latest guidelines and treatment recommendations. New research is always being done to further define the risks and treatments for high blood  pressure. Lifestylechanges  Control your weight.  Avoid smoking.  Stay physically active.  Reduce the amount of salt in your diet.  Reduce stress.  Control any chronic conditions, such as high cholesterol or diabetes.  Reduce your alcohol intake. Medicines  Several medicines  (antihypertensive medicines) are available, if needed, to bring blood pressure within a normal range. Communication  Review all the medicines you take with your caregiver because there may be side effects or interactions.  Talk with your caregiver about your diet, exercise habits, and other lifestyle factors that may be contributing to high blood pressure.  See your caregiver regularly. Your caregiver can help you create and adjust your plan for managing high blood pressure. RECOMMENDATIONS FOR TREATMENT AND FOLLOW-UP  The following recommendations are based on current guidelines for managing high blood pressure in nonpregnant adults. Use these recommendations to identify the proper follow-up period or treatment option based on your blood pressure reading. You can discuss these options with your caregiver.  Systolic pressure of 694 to 854 or diastolic pressure of 80 to 89: Follow up with your caregiver as directed.  Systolic pressure of 627 to 035 or diastolic pressure of 90 to 100: Follow up with your caregiver within 2 months.  Systolic pressure above 009 or diastolic pressure above 381: Follow up with your caregiver within 1 month.  Systolic pressure above 829 or diastolic pressure above 937: Consider antihypertensive therapy; follow up with your caregiver within 1 week.  Systolic pressure above 169 or diastolic pressure above 678: Begin antihypertensive therapy; follow up with your caregiver within 1 week. Document Released: 07/07/2012 Document Reviewed: 07/07/2012 [facility] Patient Information 2014 [place], [place].

## 2014-03-08 NOTE — Assessment & Plan Note (Signed)
Good control w/ cozaar 50 mg qd. Pt feels better on higher dose (increased from 25 mg qd)-no more HA. Stopped using ibuprophen also. Continue current meds. Check CMET in 3 mos.

## 2014-03-08 NOTE — Assessment & Plan Note (Signed)
l knee improved after aspiration of effusion. R knee now has small effusion in spite of steroid injection 2 weeks ago. Synovial fluid analysis: No crystals, WBC 450 cu mm, 25% lymphocytes. Uric acid 5.7, sed rate 20, ANA & RF Neg. DD: reactive arthritis, seroneg RA, lyme, pseudogout, psoriatic arthritis Ice, knee sleeve, voltaren gel. keep appt. W/rheum. Next week.

## 2014-03-08 NOTE — Progress Notes (Signed)
Subjective:     Theodore Maldonado is a 43 y.o. male presents for f/u of HTN & swollen knees. Regarding HTN, I increased cozaar to 50 mg qd from 25 mg qd. Pt reports he feels much better-no HA. BP well controlled in ofc today. He stopped using ibuprophen.  Regarding bilateral swollen knees, he continues to c/o R knee stiffness & pain w/increased swelling after intra-articular steroid injection 2 weeks ago. L knee much better-minimal pain, no warmth after fluid aspiration (60 mls) & intra-articular steroid injection. Synovial analysis revealed elevated WBC, no crystals. Culture was not performed. Uric acid mildly elevated, ANA & RA Neg. He has appt. With rheum next week.  The following portions of the patient's history were reviewed and updated as appropriate: allergies, current medications, past family history, past medical history, past social history, past surgical history and problem list.  Review of Systems Pertinent items are noted in HPI.    Objective:    BP 120/88  Pulse 78  Temp(Src) 97.9 F (36.6 C) (Oral)  Ht 5' 5.5" (1.664 m)  Wt 204 lb (92.534 kg)  BMI 33.42 kg/m2  SpO2 95% BP 120/88  Pulse 78  Temp(Src) 97.9 F (36.6 C) (Oral)  Ht 5' 5.5" (1.664 m)  Wt 204 lb (92.534 kg)  BMI 33.42 kg/m2  SpO2 95% General appearance: alert, cooperative, appears stated age and no distress Head: Normocephalic, without obvious abnormality, atraumatic Eyes: negative findings: lids and lashes normal and conjunctivae and sclerae normal Lungs: clear to auscultation bilaterally Heart: regular rate and rhythm, S1, S2 normal, no murmur, click, rub or gallop Extremities: extremities normal, atraumatic, no cyanosis or edema and R knee moderate effusion, warm, crepitus. L knee slightly warm, no swelling, crepitus. FROM bilat.    Assessment:     1. Essential hypertension, benign   2. Elevated liver enzymes   3. Bilateral knee effusions     Plan:     See problem list for complete A&P Cmet in  3 mos F/u 6 mos.

## 2014-03-08 NOTE — Assessment & Plan Note (Signed)
Mildly elevated AST. No palpable liver enlargement. Monitor-Cmet in 3 mos.

## 2014-06-05 LAB — TB SKIN TEST
INDURATION: 0 mm
TB Skin Test: NEGATIVE

## 2014-06-07 LAB — HEPATITIS C ANTIBODY, REFLEX: Hep C Virus Ab: <0.1

## 2014-06-07 LAB — HLA-B27 ANTIGEN
ANA Ab, IFA: NEGATIVE
CCP Antibodies IgG/IgA: 3
CCP IGG ANTIBODIES: 3
ESR: 7
HLA-B27: NEGATIVE

## 2014-06-07 LAB — C-REACTIVE PROTEIN: CRP: 2.3

## 2014-06-07 LAB — HEPATIC FUNCTION PANEL
HEP A IGM: NEGATIVE
Hep B E Ag: NEGATIVE

## 2014-06-09 ENCOUNTER — Other Ambulatory Visit: Payer: BC Managed Care – PPO

## 2014-06-22 ENCOUNTER — Encounter: Payer: Self-pay | Admitting: Nurse Practitioner

## 2014-09-07 ENCOUNTER — Ambulatory Visit: Payer: BC Managed Care – PPO | Admitting: Nurse Practitioner

## 2014-11-13 ENCOUNTER — Other Ambulatory Visit: Payer: Self-pay

## 2014-11-13 DIAGNOSIS — I10 Essential (primary) hypertension: Secondary | ICD-10-CM

## 2014-11-13 NOTE — Telephone Encounter (Signed)
[facility] in [facility] requesting a refill on Losartan 50 mg tabs #90. Last office visit 03/08/2014.

## 2014-11-14 ENCOUNTER — Telehealth: Payer: Self-pay | Admitting: Nurse Practitioner

## 2014-11-14 MED ORDER — LOSARTAN POTASSIUM 50 MG PO TABS: 50.0000 mg | ORAL_TABLET | Freq: Every morning | ORAL | Status: DC

## 2014-11-14 NOTE — Telephone Encounter (Signed)
pls call pt: Advise He needs appt. I sent in 30 days on BP med.

## 2014-11-14 NOTE — Telephone Encounter (Signed)
Patient notified. Patient scheduled appt for 11/24/14 at 8:30am.

## 2014-11-24 ENCOUNTER — Ambulatory Visit (INDEPENDENT_AMBULATORY_CARE_PROVIDER_SITE_OTHER): Payer: BLUE CROSS/BLUE SHIELD | Admitting: Nurse Practitioner

## 2014-11-24 ENCOUNTER — Encounter: Payer: Self-pay | Admitting: Nurse Practitioner

## 2014-11-24 VITALS — BP 130/90 | HR 69 | Temp 97.6°F | Ht 65.5 in | Wt 210.0 lb

## 2014-11-24 DIAGNOSIS — M25462 Effusion, left knee: Secondary | ICD-10-CM

## 2014-11-24 DIAGNOSIS — M25461 Effusion, right knee: Secondary | ICD-10-CM

## 2014-11-24 DIAGNOSIS — Z6834 Body mass index (BMI) 34.0-34.9, adult: Secondary | ICD-10-CM

## 2014-11-24 DIAGNOSIS — I10 Essential (primary) hypertension: Secondary | ICD-10-CM

## 2014-11-24 LAB — COMPREHENSIVE METABOLIC PANEL
ALBUMIN: 4.2 g/dL (ref 3.5–5.2)
ALT: 29 U/L (ref 0–53)
AST: 26 U/L (ref 0–37)
Alkaline Phosphatase: 65 U/L (ref 39–117)
BUN: 16 mg/dL (ref 6–23)
CALCIUM: 9.5 mg/dL (ref 8.4–10.5)
CO2: 28 mEq/L (ref 19–32)
CREATININE: 1.23 mg/dL (ref 0.40–1.50)
Chloride: 105 mEq/L (ref 96–112)
GFR: 82.84 mL/min (ref >60.00)
GLUCOSE: 69 mg/dL — AB (ref 70–99)
POTASSIUM: 4.6 meq/L (ref 3.5–5.1)
Sodium: 138 mEq/L (ref 135–145)
TOTAL PROTEIN: 7.7 g/dL (ref 6.0–8.3)
Total Bilirubin: 0.4 mg/dL (ref 0.2–1.2)

## 2014-11-24 LAB — MICROALBUMIN / CREATININE URINE RATIO
Creatinine,U: 165 mg/dL
MICROALB UR: 22.5 mg/dL — AB (ref 0.0–1.9)
Microalb Creat Ratio: 13.6 mg/g (ref 0.0–30.0)

## 2014-11-24 LAB — CBC
HCT: 43.7 % (ref 39.0–52.0)
HEMOGLOBIN: 14.9 g/dL (ref 13.0–17.0)
MCHC: 34 g/dL (ref 30.0–36.0)
MCV: 84.8 fl (ref 78.0–100.0)
Platelets: 201 10*3/uL (ref 150.0–400.0)
RBC: 5.15 Mil/uL (ref 4.22–5.81)
RDW: 14.7 % (ref 11.5–15.5)
WBC: 5 10*3/uL (ref 4.0–10.5)

## 2014-11-24 MED ORDER — LOSARTAN POTASSIUM 50 MG PO TABS: 50.0000 mg | ORAL_TABLET | Freq: Every morning | ORAL | Status: DC

## 2014-11-24 NOTE — Progress Notes (Signed)
Subjective:     Theodore Maldonado is a 44 y.o. male presents for f/u of HTN and he c/o swollen painful knees.  HTN: current med cozaar 50 mg. Fair controll, no recent med changes, no intolerable SE meds. Walks a lot at work, but knees are painful-he has inflammatory arthritis. He has gained 6 lbs since last OV. RF: male, end organ damage: elevated mircoalbuminuria. Addressed unhealthy BMI 34 & counseled on weight loss & nutrition changes. Knees: Had eval w/Dr Trudie Reed Aug, 2015. No f/u due to cost of OV. She was planning to start him on DMARDS.Her w/u revealed synovitis & tenosynovitis of hand. Will request her final note & lab results. Knees are painful daily, stiff in am up to 1 hour. Took 2nd job, but had to quit due to pain in knees.   The following portions of the patient's history were reviewed and updated as appropriate: allergies, current medications, past family history, past medical history, past social history, past surgical history and problem list.  Review of Systems Respiratory: negative for cough Cardiovascular: negative for palpitations Gastrointestinal: negative for reflux symptoms Neurological: positive for HA when he doesn't take BP med.    Objective:    BP 130/90 mmHg  Pulse 69  Temp(Src) 97.6 F (36.4 C) (Temporal)  Ht 5' 5.5" (1.664 m)  Wt 210 lb (95.255 kg)  BMI 34.40 kg/m2  SpO2 96% BP 130/90 mmHg  Pulse 69  Temp(Src) 97.6 F (36.4 C) (Temporal)  Ht 5' 5.5" (1.664 m)  Wt 210 lb (95.255 kg)  BMI 34.40 kg/m2  SpO2 96% General appearance: alert, cooperative, appears stated age and no distress Head: Normocephalic, without obvious abnormality, atraumatic Eyes: negative findings: lids and lashes normal and conjunctivae and sclerae normal Lungs: clear to auscultation bilaterally Heart: regular rate and rhythm, S1, S2 normal, no murmur, click, rub or gallop Extremities: warm knees, mild effusion, slight limited ROM   Neck: no carotid bruit   Assessment:Plan     1. Bilateral knee effusions Inflammatory arthritis - CBC - Cyclic citrul peptide antibody, IgG; Future Will return next week for intra-art drainage & inj Request Dr Trudie Reed last OV notes Aspercream tid to front back, sides of knee  2. Essential hypertension, benign, stable - Comprehensive metabolic panel - Microalbumin / creatinine urine ratio - losartan (COZAAR) 50 MG tablet; Take 1 tablet (50 mg total) by mouth every morning.  Dispense: 90 tablet; Refill: 1  3. BMI 34.0-34.9,adult, new Discussed risk of disease, specific diet changes See pt instructions F/u next week-knees

## 2014-11-24 NOTE — Assessment & Plan Note (Signed)
No changes in med Needs to lose weight Discussed healthy BMI Make diet changes Exercise difficult due to inflammatory arthritis, but walks while at work.

## 2014-11-24 NOTE — Assessment & Plan Note (Signed)
Cut out soda & fast food-especially FF & milk shakes. Eat before going to work, while at work & light snack in evening (nuts, fruit, soup). Works 2nd shift. Discussed increased risk of chronic disease, worse BP associated with obesity.

## 2014-11-24 NOTE — Progress Notes (Signed)
Pre visit review using our clinic review tool, if applicable. No additional management support is needed unless otherwise documented below in the visit note. 

## 2014-11-24 NOTE — Assessment & Plan Note (Signed)
Pt did not f/u w/Dr Trudie Reed due to cost of OV. He presents w/bilat knee effusions today. He will return next week for aspiration & inj of steroid.

## 2014-11-24 NOTE — Patient Instructions (Addendum)
Continue blood pressure medicine.  Your body mass index is too high-34. A healthy BMI is under 25. Your risk is increased for diabetes, high blood pressure, heart disease, stroke, and prostate & colon cancer. One way to keep weight manageable, is to cut soda intake in half or cut it out. Replace it with water or club soda.  My office will call with lab results.  Please schedule appointment to drain knees.  Use aspercream on knees daily: apply quarter sized amount to front, back & sides of knees 2-3 times daily.

## 2014-11-27 LAB — CYCLIC CITRUL PEPTIDE ANTIBODY, IGG: Cyclic Citrullin Peptide Ab: <2 U/mL (ref 0.0–5.0)

## 2014-11-28 ENCOUNTER — Telehealth: Payer: Self-pay | Admitting: Nurse Practitioner

## 2014-11-28 NOTE — Telephone Encounter (Signed)
emmi mailed  °

## 2014-12-01 ENCOUNTER — Ambulatory Visit (INDEPENDENT_AMBULATORY_CARE_PROVIDER_SITE_OTHER): Payer: BLUE CROSS/BLUE SHIELD | Admitting: Nurse Practitioner

## 2014-12-01 ENCOUNTER — Encounter: Payer: Self-pay | Admitting: Nurse Practitioner

## 2014-12-01 VITALS — BP 110/84 | HR 72 | Temp 98.1°F | Ht 65.5 in | Wt 207.0 lb

## 2014-12-01 DIAGNOSIS — M25461 Effusion, right knee: Secondary | ICD-10-CM

## 2014-12-01 DIAGNOSIS — Z23 Encounter for immunization: Secondary | ICD-10-CM

## 2014-12-01 DIAGNOSIS — M25462 Effusion, left knee: Secondary | ICD-10-CM

## 2014-12-01 DIAGNOSIS — I10 Essential (primary) hypertension: Secondary | ICD-10-CM

## 2014-12-01 MED ORDER — METHYLPREDNISOLONE ACETATE 40 MG/ML IJ SUSP
40.0000 mg | Freq: Once | INTRAMUSCULAR | Status: AC
  Administered 2014-12-01: 40 mg via INTRA_ARTICULAR

## 2014-12-01 NOTE — Progress Notes (Signed)
Subjective:     Theodore Maldonado is a 44 y.o. male presents for bilateral knee effusions. He was seen in ofc last week with knee effusions & scheduled today for intra articular drainage & injection. I also reviewed weight & blood pressure. Knees: he had effusions last spring, drained, steroid injected, fluid sent for analysis & referred to Dr Trudie Reed. Pt went for initial eval but did not f/u due to cost of OV & procedures. Dr Trudie Reed indicated in her note that she would start DMARDS at f/u. I am not sure what diagnosis is. Pt has continued to have knee pain & mild to moderate effusion. L knee has improved since steroid injection last spring w/less swelling & pain. R knee has been more swollen on lateral aspect. Labs at last OV: aCCP neg makes RA unlikely. Will proceed with aspiration & injections today. HTN: well controlled since losing 3 lbs & making diet changes. Denies headaches. Microalb is elevated more than when he forst started on BP med nearly 1 yr ago. Wonder if it is r/t inflammatory arthritis?   The following portions of the patient's history were reviewed and updated as appropriate: allergies, current medications, past medical history, past social history, past surgical history and problem list.  Review of Systems Pertinent items are noted in HPI.    Objective:    BP 110/84 mmHg  Pulse 72  Temp(Src) 98.1 F (36.7 C) (Oral)  Ht 5' 5.5" (1.664 m)  Wt 207 lb (93.895 kg)  BMI 33.91 kg/m2  SpO2 96% BP 110/84 mmHg  Pulse 72  Temp(Src) 98.1 F (36.7 C) (Oral)  Ht 5' 5.5" (1.664 m)  Wt 207 lb (93.895 kg)  BMI 33.91 kg/m2  SpO2 96% General appearance: alert, cooperative, appears stated age and no distress Head: Normocephalic, without obvious abnormality, atraumatic Eyes: negative findings: lids and lashes normal and conjunctivae and sclerae normal Extremities: R Knee: warm, moderate lateral effusion. FROM. L knee: warm, small effusion, crepitus. FROM    Procedure note: Verbal  consent obtained for intraarticular injection. Potential complications explained including pain, infection.  L knee intraarticular aspiration & injection: Knee joint accessed from medial aspect in straight leg position using sterile technique. Area cleansed with 3 betadine swabs. Ethyl chloride spray applied until skin blanched. Joint space accessed using 18 ga 1 1/2 " needle. Unable to aspirate blood or fluid. 1.5 ml 1% lidocaine and 40 mg depo-medrol injected into joint space. Withdrew needle. Pt tolerated procedure with minimal pain. Pressure held at site for 1 minute. No bleeding or hematoma noted. Cleaned betadine off skin. Covered site with band-aid.  R knee intraarticular aspiration & injection:  Knee joint accessed from medial aspect in straight leg position using sterile technique. Area cleansed with 3 betadine swabs. Ethyl chloride spray applied until skin blanched. Joint space accessed using 18 ga 1 1/2 " needle. Unable to aspirate blood or fluid. 1.5 ml 1% lidocaine and 40 mg depo-medrol injected into joint space. Withdrew needle. Pt tolerated procedure with minimal pain. Pressure held at site for 1 minute. No bleeding or hematoma noted. Cleaned betadine off skin. Covered site with band-aid.   Assessment:Plan   1. Bilateral knee effusions Unable to aspirate fluid from either knee Injected both knees with 1.5 mls 1% lidocaine & 40 mg depomedrol. See procedure note - methylPREDNISolone acetate (DEPO-MEDROL) injection 40 mg; Inject 1 mL (40 mg total) into the articular space once. - methylPREDNISolone acetate (DEPO-MEDROL) injection 40 mg; Inject 1 mL (40 mg total) into the articular  space once. Wear knee sleeves when active See pt instructions Will review Dr Trudie Reed note, pt needs to follow up with her.  2. Essential hypertension, benign Continue current med Call if dizzy or lightheaded F/u 6 mos  3. Need for Tdap vaccination - Tdap vaccine greater than or equal to 7yo IM

## 2014-12-01 NOTE — Progress Notes (Signed)
Pre visit review using our clinic review tool, if applicable. No additional management support is needed unless otherwise documented below in the visit note. 

## 2014-12-01 NOTE — Assessment & Plan Note (Signed)
Well controlled Continue at current dose, but if continues to lose weight, may need to decrease dose. Pt understands to let me know if lightheaded or dizzy.

## 2014-12-01 NOTE — Patient Instructions (Signed)
No long walks today. Resume normal activity tomorrow.  Ice pack on knees 10 minutes 3 to 4 times today. I will call you once I have either spoken with Dr Trudie Reed or reviewed her note. It is likely that you will need to see her again because you have inflammatory arthritis. Treatment is important to prevent joint damage.  Great job with diet changes! You have lost 3 pounds & your blood pressure looks great! Continue the good work!  Continue blood pressure medicine. If you have episodes of feeling lightheaded or dizzy, please call my office as I may need to decrease your blood pressure medicine. Weight loss improves blood pressure, so often we decrease dose as you lose weight.  See me in 6 months.

## 2014-12-12 ENCOUNTER — Encounter: Payer: Self-pay | Admitting: *Deleted

## 2014-12-12 ENCOUNTER — Telehealth: Payer: Self-pay | Admitting: Nurse Practitioner

## 2014-12-12 DIAGNOSIS — M1990 Unspecified osteoarthritis, unspecified site: Secondary | ICD-10-CM

## 2014-12-12 NOTE — Telephone Encounter (Signed)
Reviewed Dr Trudie Reed notes dated 06/05/14. L hand US reveals inflammatory arthritis, tenosynovitis & synovitis. Pt was to f/u in 2-3 weeks. She is planning DMard therapy. Pt was unable to f/u due to cost of 1st visit-he has high deductible insurance. I called Dr Trudie Reed ofc to let her know why he did not f/u. Called pt to encourage him to f/u so inflammatory arthritis can be treated to slow/prevent permanent damage to joints.

## 2015-06-01 ENCOUNTER — Ambulatory Visit: Payer: BLUE CROSS/BLUE SHIELD | Admitting: Family Medicine

## 2015-06-26 ENCOUNTER — Ambulatory Visit: Payer: BLUE CROSS/BLUE SHIELD | Admitting: Family Medicine

## 2015-06-29 ENCOUNTER — Ambulatory Visit: Payer: BLUE CROSS/BLUE SHIELD | Admitting: Family Medicine

## 2016-01-18 ENCOUNTER — Emergency Department (HOSPITAL_BASED_OUTPATIENT_CLINIC_OR_DEPARTMENT_OTHER)
Admission: EM | Admit: 2016-01-18 | Discharge: 2016-01-19 | Disposition: A | Discharge status: Home / Self Care | Payer: BLUE CROSS/BLUE SHIELD | Attending: Emergency Medicine | Admitting: Emergency Medicine

## 2016-01-18 ENCOUNTER — Encounter (HOSPITAL_BASED_OUTPATIENT_CLINIC_OR_DEPARTMENT_OTHER): Payer: Self-pay | Admitting: *Deleted

## 2016-01-18 DIAGNOSIS — I10 Essential (primary) hypertension: Secondary | ICD-10-CM | POA: Diagnosis not present

## 2016-01-18 DIAGNOSIS — Z8739 Personal history of other diseases of the musculoskeletal system and connective tissue: Secondary | ICD-10-CM | POA: Insufficient documentation

## 2016-01-18 NOTE — ED Notes (Signed)
Pt has not had his blood pressure medication in several months.  He states that today he was having a headache with lightheadedness and photosensitivity.  He denies chest pain, denies SOB, denies numbness or weakness and neuro exam is normal.

## 2016-01-18 NOTE — ED Notes (Addendum)
Pt reports HTN.  States that he hasn't taken his rx meds in at least 6 months.  States that he has had a HA that started today.

## 2016-01-19 MED ORDER — LOSARTAN POTASSIUM 50 MG PO TABS: 50.0000 mg | ORAL_TABLET | Freq: Every day | ORAL | Status: DC

## 2016-01-19 MED ORDER — KETOROLAC TROMETHAMINE 15 MG/ML IJ SOLN: 15.0000 mg | Freq: Once | INTRAMUSCULAR | Status: DC

## 2016-01-19 MED ORDER — LOSARTAN POTASSIUM 50 MG PO TABS
50.0000 mg | ORAL_TABLET | Freq: Every day | ORAL | Status: DC
  Filled 2016-01-19: qty 1

## 2016-01-19 NOTE — ED Provider Notes (Signed)
CSN: [id number]     Arrival date & time 01/18/16  2230 History   None    Chief Complaint  Patient presents with  . Hypertension    HPI   7 old male presents with complaints hypertension. Patient reports that today he is suffering from a minor frontal headache. He reports this is similar to previous headaches, this is noted when his blood pressure begins to elevate. He reports taking 3 Advil prior to arrival which did not improve his symptoms. Patient denies any neurological deficits, changes in smell or taste, neck stiffness, fever, head trauma, any other significant headache red flags. Patient denies any chest pain, abdominal pain, changes in color and clarity characteristics reveal a concerning sensorineural damage. Patient reports history of hypertension, has not taken medication in over a year. He was previously taking losartan 50 mg daily.    Past Medical History  Diagnosis Date  . Hypertension   . Frequent headaches   . Migraines   . Elbow effusion     R  . Gout     toe   Past Surgical History  Procedure Laterality Date  . Testicle torsion reduction      child   Family History  Problem Relation Age of Onset  . Hypertension Mother   . Hypertension Sister    Social History  Substance Use Topics  . Smoking status: Never Smoker   . Smokeless tobacco: Never Used  . Alcohol Use: Yes     Comment: occasional    Review of Systems  All other systems reviewed and are negative.   Allergies  Review of patient's allergies indicates no known allergies.  Home Medications   Prior to Admission medications   Medication Sig Start Date End Date Taking? Authorizing Provider  losartan (COZAAR) 50 MG tablet Take 1 tablet (50 mg total) by mouth daily. 01/19/16   Hina Gupta, PA-C   BP 181/112 mmHg  Pulse 69  Temp(Src) 97.9 F (36.6 C) (Oral)  Resp 18  Ht 5\' 7"  (1.702 m)  Wt 97.523 kg  BMI 33.67 kg/m2  SpO2 98%   Physical Exam  Constitutional: He is oriented to person,  place, and time. He appears well-developed and well-nourished.  HENT:  Head: Normocephalic and atraumatic.  Eyes: Conjunctivae are normal. Pupils are equal, round, and reactive to light. Right eye exhibits no discharge. Left eye exhibits no discharge. No scleral icterus.  Neck: Normal range of motion. No JVD present. No tracheal deviation present.  Cardiovascular: Normal rate, regular rhythm and normal heart sounds.  Exam reveals no gallop and no friction rub.   No murmur heard. Pulmonary/Chest: Effort normal and breath sounds normal. No stridor. No respiratory distress. He has no wheezes. He has no rales. He exhibits no tenderness.  Neurological: He is alert and oriented to person, place, and time. He has normal strength. No cranial nerve deficit or sensory deficit. Coordination and gait normal. GCS eye subscore is 4. GCS verbal subscore is 5. GCS motor subscore is 6.  Psychiatric: He has a normal mood and affect. His behavior is normal. Judgment and thought content normal.  Nursing note and vitals reviewed.   ED Course  Procedures (including critical care time) Labs Review Labs Reviewed - No data to display  Imaging Review No results found. I have personally reviewed and evaluated these images and lab results as part of my medical decision-making.   EKG Interpretation None      MDM   Final diagnoses:  Essential hypertension  Labs:  Imaging:  Consults:  Therapeutics: Losartan  Discharge Meds: Losartan  Assessment/Plan: 45 year old male with history of hypertension presents today with hypertension. Patient has a very mild headache, no neurological deficits no headache red flags. This is similar to previous, reports this happens when his blood pressure goes up. Patient has no signs of significant end organ damage. Patient will be started on his antihypertensive medication again, encouraged follow-up with primary care provider in next 3 days for reevaluation further  management. He is given strict Cautions, verbalized understanding and agreement No further questions or concerns time discharged        Okey Regal, PA-C 01/19/16 0055  Shanon Rosser, MD 01/19/16 [id number]

## 2016-01-19 NOTE — ED Notes (Signed)
Pt verbalizes understanding of d/c instructions and denies any further needs at this time. 

## 2016-01-19 NOTE — Discharge Instructions (Signed)
Hypertension Hypertension, commonly called high blood pressure, is when the force of blood pumping through your arteries is too strong. Your arteries are the blood vessels that carry blood from your heart throughout your body. A blood pressure reading consists of a higher number over a lower number, such as 110/72. The higher number (systolic) is the pressure inside your arteries when your heart pumps. The lower number (diastolic) is the pressure inside your arteries when your heart relaxes. Ideally you want your blood pressure below 120/80. Hypertension forces your heart to work harder to pump blood. Your arteries may become narrow or stiff. Having untreated or uncontrolled hypertension can cause heart attack, stroke, kidney disease, and other problems. RISK FACTORS Some risk factors for high blood pressure are controllable. Others are not.  Risk factors you cannot control include:   Race. You may be at higher risk if you are African American.  Age. Risk increases with age.  Gender. Men are at higher risk than women before age 45 years. After age 65, women are at higher risk than men. Risk factors you can control include:  Not getting enough exercise or physical activity.  Being overweight.  Getting too much fat, sugar, calories, or salt in your diet.  Drinking too much alcohol. SIGNS AND SYMPTOMS Hypertension does not usually cause signs or symptoms. Extremely high blood pressure (hypertensive crisis) may cause headache, anxiety, shortness of breath, and nosebleed. DIAGNOSIS To check if you have hypertension, your health care provider will measure your blood pressure while you are seated, with your arm held at the level of your heart. It should be measured at least twice using the same arm. Certain conditions can cause a difference in blood pressure between your right and left arms. A blood pressure reading that is higher than normal on one occasion does not mean that you need treatment. If  it is not clear whether you have high blood pressure, you may be asked to return on a different day to have your blood pressure checked again. Or, you may be asked to monitor your blood pressure at home for 1 or more weeks. TREATMENT Treating high blood pressure includes making lifestyle changes and possibly taking medicine. Living a healthy lifestyle can help lower high blood pressure. You may need to change some of your habits. Lifestyle changes may include:  Following the DASH diet. This diet is high in fruits, vegetables, and whole grains. It is low in salt, red meat, and added sugars.  Keep your sodium intake below 2,300 mg per day.  Getting at least 30-45 minutes of aerobic exercise at least 4 times per week.  Losing weight if necessary.  Not smoking.  Limiting alcoholic beverages.  Learning ways to reduce stress. Your health care provider may prescribe medicine if lifestyle changes are not enough to get your blood pressure under control, and if one of the following is true:  You are 18-59 years of age and your systolic blood pressure is above 140.  You are 60 years of age or older, and your systolic blood pressure is above 150.  Your diastolic blood pressure is above 90.  You have diabetes, and your systolic blood pressure is over 140 or your diastolic blood pressure is over 90.  You have kidney disease and your blood pressure is above 140/90.  You have heart disease and your blood pressure is above 140/90. Your personal target blood pressure may vary depending on your medical conditions, your age, and other factors. HOME CARE INSTRUCTIONS    Have your blood pressure rechecked as directed by your health care provider.   Take medicines only as directed by your health care provider. Follow the directions carefully. Blood pressure medicines must be taken as prescribed. The medicine does not work as well when you skip doses. Skipping doses also puts you at risk for  problems.  Do not smoke.   Monitor your blood pressure at home as directed by your health care provider. SEEK MEDICAL CARE IF:   You think you are having a reaction to medicines taken.  You have recurrent headaches or feel dizzy.  You have swelling in your ankles.  You have trouble with your vision. SEEK IMMEDIATE MEDICAL CARE IF:  You develop a severe headache or confusion.  You have unusual weakness, numbness, or feel faint.  You have severe chest or abdominal pain.  You vomit repeatedly.  You have trouble breathing. MAKE SURE YOU:   Understand these instructions.  Will watch your condition.  Will get help right away if you are not doing well or get worse.   This information is not intended to replace advice given to you by your health care provider. Make sure you discuss any questions you have with your health care provider.   Document Released: 10/13/2005 Document Revised: 02/27/2015 Document Reviewed: 08/05/2013 Elsevier Interactive Patient Education 2016 Else[employer] Inc[employer] DASH Eating Plan DASH stands for "Dietary Approaches to Stop Hypertension." The DASH eating plan is a healthy eating plan that has been shown to reduce high blood pressure (hypertension). Additional health benefits may include reducing the risk of type 2 diabetes mellitus, heart disease, and stroke. The DASH eating plan may also help with weight loss. WHAT DO I NEED TO KNOW ABOUT THE DASH EATING PLAN? For the DASH eating plan, you will follow these general guidelines:  Choose foods with a percent daily value for sodium of less than 5% (as listed on the food label).  Use salt-free seasonings or herbs instead of table salt or sea salt.  Check with your health care provider or pharmacist before using salt substitutes.  Eat lower-sodium products, often labeled as "lower sodium" or "no salt added."  Eat fresh foods.  Eat more vegetables, fruits, and low-fat dairy products.  Choose whole grains.  Look for the word "whole" as the first word in the ingredient list.  Choose fish and skinless chicken or turkey more often than red meat. Limit fish, poultry, and meat to 6 oz (170 g) each day.  Limit sweets, desserts, sugars, and sugary drinks.  Choose heart-healthy fats.  Limit cheese to 1 oz (28 g) per day.  Eat more home-cooked food and less restaurant, buffet, and fast food.  Limit fried foods.  Cook foods using methods other than frying.  Limit canned vegetables. If you do use them, rinse them well to decrease the sodium.  When eating at a restaurant, ask that your food be prepared with less salt, or no salt if possible. WHAT FOODS CAN I EAT? Seek help from a dietitian for individual calorie needs. Grains Whole grain or whole wheat bread. Brown rice. Whole grain or whole wheat pasta. Quinoa, bulgur, and whole grain cereals. Low-sodium cereals. Corn or whole wheat flour tortillas. Whole grain cornbread. Whole grain crackers. Low-sodium crackers. Vegetables Fresh or frozen vegetables (raw, steamed, roasted, or grilled). Low-sodium or reduced-sodium tomato and vegetable juices. Low-sodium or reduced-sodium tomato sauce and paste. Low-sodium or reduced-sodium canned vegetables.  Fruits All fresh, canned (in natural juice), or frozen fruits. Meat and Other   Protein Products Ground beef (85% or leaner), grass-fed beef, or beef trimmed of fat. Skinless chicken or turkey. Ground chicken or turkey. Pork trimmed of fat. All fish and seafood. Eggs. Dried beans, peas, or lentils. Unsalted nuts and seeds. Unsalted canned beans. Dairy Low-fat dairy products, such as skim or 1% milk, 2% or reduced-fat cheeses, low-fat ricotta or cottage cheese, or plain low-fat yogurt. Low-sodium or reduced-sodium cheeses. Fats and Oils Tub margarines without trans fats. Light or reduced-fat mayonnaise and salad dressings (reduced sodium). Avocado. Safflower, olive, or canola oils. Natural peanut or almond  butter. Other Unsalted popcorn and pretzels. The items listed above may not be a complete list of recommended foods or beverages. Contact your dietitian for more options. WHAT FOODS ARE NOT RECOMMENDED? Grains White bread. White pasta. White rice. Refined cornbread. Bagels and croissants. Crackers that contain trans fat. Vegetables Creamed or fried vegetables. Vegetables in a cheese sauce. Regular canned vegetables. Regular canned tomato sauce and paste. Regular tomato and vegetable juices. Fruits Dried fruits. Canned fruit in light or heavy syrup. Fruit juice. Meat and Other Protein Products Fatty cuts of meat. Ribs, chicken wings, bacon, sausage, bologna, salami, chitterlings, fatback, hot dogs, bratwurst, and packaged luncheon meats. Salted nuts and seeds. Canned beans with salt. Dairy Whole or 2% milk, cream, half-and-half, and cream cheese. Whole-fat or sweetened yogurt. Full-fat cheeses or blue cheese. Nondairy creamers and whipped toppings. Processed cheese, cheese spreads, or cheese curds. Condiments Onion and garlic salt, seasoned salt, table salt, and sea salt. Canned and packaged gravies. Worcestershire sauce. Tartar sauce. Barbecue sauce. Teriyaki sauce. Soy sauce, including reduced sodium. Steak sauce. Fish sauce. Oyster sauce. Cocktail sauce. Horseradish. Ketchup and mustard. Meat flavorings and tenderizers. Bouillon cubes. Hot sauce. Tabasco sauce. Marinades. Taco seasonings. Relishes. Fats and Oils Butter, stick margarine, lard, shortening, ghee, and bacon fat. Coconut, palm kernel, or palm oils. Regular salad dressings. Other Pickles and olives. Salted popcorn and pretzels. The items listed above may not be a complete list of foods and beverages to avoid. Contact your dietitian for more information. WHERE CAN I FIND MORE INFORMATION? National Heart, Lung, and [employer] Institute: www.nhlbi.nih.gov/health/health-topics/topics/dash/   This information is not intended to replace  advice given to you by your health care provider. Make sure you discuss any questions you have with your health care provider.   Document Released: 10/02/2011 Document Revised: 11/03/2014 Document Reviewed: 08/17/2013 Elsevier Interactive Patient Education 2016 Elsevier Inc[employer]  

## 2016-02-21 ENCOUNTER — Telehealth: Payer: Self-pay | Admitting: Medical

## 2016-02-21 ENCOUNTER — Encounter: Payer: BLUE CROSS/BLUE SHIELD | Admitting: Medical

## 2016-02-21 NOTE — Telephone Encounter (Signed)
Per appt notes pt left VM 8:08am today that he had a death in the family. Charge or no charge?

## 2016-02-21 NOTE — Progress Notes (Signed)
This encounter was created in error - please disregard.  This encounter was created in error - please disregard.

## 2016-02-21 NOTE — Telephone Encounter (Signed)
No charge. 

## 2016-08-28 ENCOUNTER — Telehealth: Payer: Self-pay | Admitting: Behavioral Health

## 2016-08-28 NOTE — Telephone Encounter (Signed)
Unable to reach patient at time of Pre-Visit Call.  Left message for patient to return call when available.    

## 2016-08-29 ENCOUNTER — Ambulatory Visit (INDEPENDENT_AMBULATORY_CARE_PROVIDER_SITE_OTHER): Payer: BLUE CROSS/BLUE SHIELD | Admitting: Medical

## 2016-08-29 ENCOUNTER — Encounter: Payer: Self-pay | Admitting: Medical

## 2016-08-29 VITALS — BP 150/90 | HR 80 | Temp 98.1°F | Ht 66.0 in | Wt 210.6 lb

## 2016-08-29 DIAGNOSIS — Z23 Encounter for immunization: Secondary | ICD-10-CM | POA: Diagnosis not present

## 2016-08-29 DIAGNOSIS — I10 Essential (primary) hypertension: Secondary | ICD-10-CM | POA: Diagnosis not present

## 2016-08-29 MED ORDER — LOSARTAN POTASSIUM 50 MG PO TABS: 50.0000 mg | ORAL_TABLET | Freq: Every day | ORAL | 5 refills | Status: DC

## 2016-08-29 NOTE — Progress Notes (Signed)
Pre visit review using our clinic review tool, if applicable. No additional management support is needed unless otherwise documented below in the visit note. 

## 2016-08-29 NOTE — Patient Instructions (Addendum)
For your htn will refill your losartan today. Please get cmp today to check your kidney function.  We gave you a flu vaccine today  I would ask that you schedule a CPE in 3-4 weeks. Come in fasting so we can include lipid panel in the labs.  Also would recommend getting bp and checking on occasion. Would like your bp to be 130/80 ideally.

## 2016-08-29 NOTE — Progress Notes (Signed)
Subjective:    Patient ID: Theodore Maldonado, male    DOB: 1970/11/15, 45 y.o.   MRN: [id number]  HPI  I have reviewed pt PMH, PSH, FH, Social History and Surgical History  Pt states his fiancee come to our office.  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 No exercise, pt admits not eating healthy, pt drinks 3-5 cups a day. Engaged and will get married sept 2018. Pt does not drink alcohol. Pt enjoys bowling.   Pt ran out of his bp medications in past when his pcp left former practice. So he needs refills. No cardiac or neurologic signs or symptoms.  Pt does get flu vaccine.    Review of Systems  Constitutional: Negative for chills, fatigue and fever.  HENT: Negative for congestion, facial swelling, postnasal drip, rhinorrhea, sneezing and tinnitus.   Respiratory: Negative for cough, chest tightness, shortness of breath and wheezing.   Cardiovascular: Negative for chest pain and palpitations.  Gastrointestinal: Negative for abdominal pain.  Endocrine: Negative for polydipsia, polyphagia and polyuria.  Musculoskeletal: Negative for back pain.  Skin: Negative for rash.  Neurological: Negative for dizziness, speech difficulty, weakness, light-headedness and headaches.  Hematological: Negative for adenopathy. Does not bruise/bleed easily.  Psychiatric/Behavioral: Negative for behavioral problems and confusion.    Past Medical History:  Diagnosis Date  . Elbow effusion    R  . Frequent headaches   . Gout    toe  . Hypertension   . Migraines      Social History   Social History  . Marital status: Single    Spouse name: N/A  . Number of children: 2  . 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 Smoking status: Never Smoker  . Smokeless tobacco: Never Used  . Alcohol use Yes     Comment: occasional  . Drug use: No  . Sexual activity: No    Other Topics Concern  . Not on file   Social History Narrative   Mr. Beisel lives with his sister. He has two daughters. Works [employer].    Past Surgical History:  Procedure Laterality Date  . TESTICLE TORSION REDUCTION     child    Family History  Problem Relation Age of Onset  . Hypertension Mother   . Hypertension Sister     No Known Allergies  No current outpatient prescriptions on file prior to visit.   No current facility-administered medications on file prior to visit.     BP (!) 140/106 (BP Location: Right Arm, Patient Position: Sitting)   Pulse 80   Temp 98.1 F (36.7 C) (Oral)   Ht 5\' 6"  (1.676 m)   Wt 210 lb 9.6 oz (95.5 kg)   SpO2 97%   BMI 33.99 kg/m       Objective:   Physical Exam  General Mental Status- Alert. General Appearance- Not in acute distress.   Skin General: Color- Normal Color. Moisture- Normal Moisture.  Neck Carotid Arteries- Normal color. Moisture- Normal Moisture. No carotid bruits. No JVD.  Chest and Lung Exam Auscultation: Breath Sounds:-Normal.  Cardiovascular Auscultation:Rythm- Regular. Murmurs & Other Heart Sounds:Auscultation of the heart reveals- No Murmurs.  Abdomen Inspection:-Inspeection Normal. Palpation/Percussion:Note:No mass. Palpation and Percussion of the abdomen reveal- Non Tender, Non Distended + BS, no rebound or guarding.  Neurologic Cranial Nerve exam:- CN III-XII intact(No  nystagmus), symmetric smile. Drift Test:- No drift. Romberg Exam:- Negative.  Heal to Toe Gait exam:-Normal. Finger to Nose:- Normal/Intact Strength:- 5/5 equal and symmetric strength both upper and lower extremities.      Assessment & Plan:  For your htn will refill your losartan today. Please get cmp today to check your kidney function.  We gave you a flu vaccine today  I would ask that you schedule a CPE in 3-4 weeks. Come in fasting so we can include lipid panel in the labs.

## 2016-08-30 LAB — COMPREHENSIVE METABOLIC PANEL
ALT: 26 U/L (ref 9–46)
AST: 26 U/L (ref 10–40)
Albumin: 4.1 g/dL (ref 3.6–5.1)
Alkaline Phosphatase: 66 U/L (ref 40–115)
BUN: 14 mg/dL (ref 7–25)
CHLORIDE: 102 mmol/L (ref 98–110)
CO2: 24 mmol/L (ref 20–31)
CREATININE: 1.19 mg/dL (ref 0.60–1.35)
Calcium: 9.3 mg/dL (ref 8.6–10.3)
GLUCOSE: 82 mg/dL (ref 65–99)
POTASSIUM: 4 mmol/L (ref 3.5–5.3)
SODIUM: 137 mmol/L (ref 135–146)
Total Bilirubin: 0.7 mg/dL (ref 0.2–1.2)
Total Protein: 7.9 g/dL (ref 6.1–8.1)

## 2016-09-29 ENCOUNTER — Telehealth: Payer: Self-pay | Admitting: Medical

## 2016-09-29 NOTE — Telephone Encounter (Signed)
I saw pt for the first time. He needs bp check office visit or nurse visit at least. He may need a higher dose of medication. I could send in 90 tab with 3 refills if we get confirmed controled bp reading.   In addition to that new guidelines since I last saw him are tighter. Now want to be close to 130/80. Please notify pt.

## 2016-09-29 NOTE — Telephone Encounter (Signed)
Called and spoke with patient. I informed him that Percell Miller would like him to come in to have his BP checked. He stated that his schedule is crazy right now but he would call back later this week to get scheduled.

## 2016-10-23 ENCOUNTER — Other Ambulatory Visit: Payer: Self-pay

## 2016-10-23 MED ORDER — LOSARTAN POTASSIUM 50 MG PO TABS: 50.0000 mg | ORAL_TABLET | Freq: Every day | ORAL | 1 refills | Status: DC

## 2016-12-25 ENCOUNTER — Encounter: Payer: Self-pay | Admitting: Medical

## 2016-12-25 ENCOUNTER — Ambulatory Visit (INDEPENDENT_AMBULATORY_CARE_PROVIDER_SITE_OTHER): Payer: BLUE CROSS/BLUE SHIELD | Admitting: Medical

## 2016-12-25 ENCOUNTER — Ambulatory Visit (HOSPITAL_BASED_OUTPATIENT_CLINIC_OR_DEPARTMENT_OTHER)
Admission: RE | Admit: 2016-12-25 | Discharge: 2016-12-25 | Disposition: A | Discharge status: Home / Self Care | Payer: BLUE CROSS/BLUE SHIELD | Source: Ambulatory Visit | Attending: Medical | Admitting: Medical

## 2016-12-25 VITALS — BP 139/89 | HR 77 | Temp 98.0°F | Ht 65.6 in | Wt 214.2 lb

## 2016-12-25 DIAGNOSIS — M25561 Pain in right knee: Secondary | ICD-10-CM

## 2016-12-25 DIAGNOSIS — E785 Hyperlipidemia, unspecified: Secondary | ICD-10-CM | POA: Diagnosis not present

## 2016-12-25 DIAGNOSIS — R2232 Localized swelling, mass and lump, left upper limb: Secondary | ICD-10-CM | POA: Diagnosis not present

## 2016-12-25 DIAGNOSIS — G8929 Other chronic pain: Secondary | ICD-10-CM

## 2016-12-25 DIAGNOSIS — M6740 Ganglion, unspecified site: Secondary | ICD-10-CM

## 2016-12-25 DIAGNOSIS — M1712 Unilateral primary osteoarthritis, left knee: Secondary | ICD-10-CM | POA: Diagnosis not present

## 2016-12-25 DIAGNOSIS — I10 Essential (primary) hypertension: Secondary | ICD-10-CM

## 2016-12-25 DIAGNOSIS — M25461 Effusion, right knee: Secondary | ICD-10-CM | POA: Diagnosis not present

## 2016-12-25 DIAGNOSIS — M25532 Pain in left wrist: Secondary | ICD-10-CM

## 2016-12-25 NOTE — Progress Notes (Signed)
Pre visit review using our clinic tool,if applicable. No additional management support is needed unless otherwise documented below in the visit note.  

## 2016-12-25 NOTE — Progress Notes (Signed)
Subjective:    Patient ID: Theodore Maldonado, male    DOB: [birth year], 46 y.o.   MRN: [id number]  HPI  Pt is for follow up.  Pt bp is little better initially on check today. Pt has not been checking his bp regularly. He states randomly at [employer]. He thinks bp was not over 140/90. No cardiac or neurologic signs or symptoms.  Pt cholesterol/ldl mild elevated 2 years ago. Pt states he is trying to eat healthy.  Review of Systems  Constitutional: Negative for chills and fever.  Eyes: Negative for photophobia.  Respiratory: Negative for cough, shortness of breath and wheezing.   Cardiovascular: Negative for chest pain, palpitations and leg swelling.  Musculoskeletal: Negative for neck pain.       Left wrist dorsal aspect bulge for years. Pt is rt handed.   Rt knee swelling lateral aspect for years. Area tightens up at times when swells.   Neurological: Negative for dizziness, tremors, seizures and weakness.  Hematological: Negative for adenopathy.  Psychiatric/Behavioral: Negative for agitation, confusion, decreased concentration, dysphoric mood and sleep disturbance.   Past Medical History:  Diagnosis Date  . Elbow effusion    R  . Frequent headaches   . Gout    toe  . Hypertension   . Migraines      Social History   Social History  . Marital status: Single    Spouse name: N/A  . Number of children: 2  . 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 Smoking status: Never Smoker  . Smokeless tobacco: Never Used  . Alcohol use Yes     Comment: occasional  . Drug use: No  . Sexual activity: No   Other Topics Concern  . Not on file   Social History Narrative   Mr. Luffman lives with his sister. He has two daughters. Works [employer].    Past Surgical History:  Procedure Laterality Date  . TESTICLE TORSION REDUCTION     child    Family History  Problem Relation Age of Onset  . Hypertension  Mother   . Hypertension Sister     No Known Allergies  Current Outpatient Prescriptions on File Prior to Visit  Medication Sig Dispense Refill  . losartan (COZAAR) 50 MG tablet Take 1 tablet (50 mg total) by mouth daily. 90 tablet 1   No current facility-administered medications on file prior to visit.     BP 140/70   Pulse 77   Temp 98 F (36.7 C) (Oral)   Ht 5' 5.6" (1.666 m)   Wt 214 lb 3.2 oz (97.2 kg)   SpO2 99%   BMI 35.00 kg/m       Objective:   Physical Exam  General Mental Status- Alert. General Appearance- Not in acute distress.   Skin General: Color- Normal Color. Moisture- Normal Moisture.  Neck Carotid Arteries- Normal color. Moisture- Normal Moisture. No carotid bruits. No JVD.  Chest and Lung Exam Auscultation: Breath Sounds:-Normal.  Cardiovascular Auscultation:Rythm- Regular. Murmurs & Other Heart Sounds:Auscultation of the heart reveals- No Murmurs.  Abdomen Inspection:-Inspeection Normal. Palpation/Percussion:Note:No mass. Palpation and Percussion of the abdomen reveal- Non Tender, Non Distended + BS, no rebound or guarding.   Neurologic Cranial Nerve exam:- CN III-XII intact(No nystagmus), symmetric smile. Strength:- 5/5 equal and symmetric strength both upper and lower extremities.  Left wrist- dorsal aspect has what feels to be moderate sized  ganglion cyst.  Rt thigh/knee- distal aspect of thigh. Right above the lateral knee swollen area. Knee good rom and no crepitus or instability.     Assessment & Plan:  For your high blood pressure I want you to continue current bp medications.  On next visit I want to check you cholesterol so come in fasting. I want you to also get bp cuff otc and check bp 2 times a week to confirm consistently less thanb 140/90.  For our wrist swollen area I think you have a ganglion cyst. Will xray of your wrist and refer you to hand surgeon.  For your swollen area distal thigh/knee area will get xray of  area. Will refer you to sports medicine.  Follow up in 3 months or as needed   Trelon Plush, Percell Miller, [employer]

## 2016-12-25 NOTE — Patient Instructions (Addendum)
For your high blood pressure I want you to continue current bp medications.  On next visit I want to check you cholesterol so come in fasting. I want you to also get bp cuff otc and check bp 2 times a week to confirm consistently less than 140/90.  For our wrist swollen area I think you have a ganglion cyst. Will xray of your wrist and refer you to hand surgeon.  For your swollen area distal thigh/knee area will get xray of area. Will refer you to sports medicine.  Follow up in 3 months or as needed

## 2017-01-16 ENCOUNTER — Encounter: Payer: Self-pay | Admitting: Family Medicine

## 2017-01-16 ENCOUNTER — Ambulatory Visit (INDEPENDENT_AMBULATORY_CARE_PROVIDER_SITE_OTHER): Payer: BLUE CROSS/BLUE SHIELD | Admitting: Family Medicine

## 2017-01-16 VITALS — BP 153/98 | HR 40 | Ht 66.0 in | Wt 213.0 lb

## 2017-01-16 DIAGNOSIS — M25561 Pain in right knee: Secondary | ICD-10-CM

## 2017-01-16 DIAGNOSIS — G8929 Other chronic pain: Secondary | ICD-10-CM | POA: Diagnosis not present

## 2017-01-16 MED ORDER — METHYLPREDNISOLONE ACETATE 40 MG/ML IJ SUSP
40.0000 mg | Freq: Once | INTRAMUSCULAR | Status: AC
  Administered 2017-01-16: 40 mg via INTRA_ARTICULAR

## 2017-01-16 NOTE — Patient Instructions (Signed)
We aspirated and injected your right knee. Your prior labs indicate this is not due to an inflammatory arthritis (like rheumatoid arthritis, gout, etc). We will go ahead with an MRI to determine the cause of the persistent swelling and why this isn't responding to injections like it normally should. In meantime icing 15 minutes at a time 3-4 times a day. Ibuprofen or aleve if needed. Compression with sleeve or ACE wrap should help keep swelling down. Elevate above your heart when possible.

## 2017-01-19 DIAGNOSIS — M25461 Effusion, right knee: Secondary | ICD-10-CM | POA: Insufficient documentation

## 2017-01-19 DIAGNOSIS — M25561 Pain in right knee: Secondary | ICD-10-CM | POA: Insufficient documentation

## 2017-01-19 NOTE — Progress Notes (Signed)
PCP and consultation requested by: Mackie Pai, PA-C  Subjective:   HPI: Patient is a 46 y.o. male here for right knee pain.  Patient reports having 2-3 years of chronic right knee pain. Pain comes and goes. Pain is more of a tightness, 0/10 currently. Worse at end of work day (works 2 jobs). + swelling. History of aspirations and injections. Left knee is doing well, right knee still bothering. No recent injury or trauma. Has been icing and using compression. No skin changes, numbness.  Past Medical History:  Diagnosis Date  . Elbow effusion    R  . Frequent headaches   . Gout    toe  . Hypertension   . Migraines     Current Outpatient Prescriptions on File Prior to Visit  Medication Sig Dispense Refill  . losartan (COZAAR) 50 MG tablet Take 1 tablet (50 mg total) by mouth daily. 90 tablet 1   No current facility-administered medications on file prior to visit.     Past Surgical History:  Procedure Laterality Date  . TESTICLE TORSION REDUCTION     child    No Known Allergies  Social History   Social History  . Marital status: Single    Spouse name: N/A  . Number of children: 2  . 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 Smoking status: Never Smoker  . Smokeless tobacco: Never Used  . Alcohol use Yes     Comment: occasional  . Drug use: No  . Sexual activity: No   Other Topics Concern  . Not on file   Social History Narrative   Mr. Grob lives with his sister. He has two daughters. Works [employer].    Family History  Problem Relation Age of Onset  . Hypertension Mother   . Hypertension Sister     BP (!) 153/98   Pulse (!) 40   Ht _0  (1.676 m)   Wt 213 lb (96.6 kg)   BMI 34.38 kg/m   Review of Systems: See HPI above.     Objective:  Physical Exam:  Gen: NAD, comfortable in exam room  Right knee: Large effusion.  No bruising, other deformity. TTP  suprapatellar pouch only.  No joint line, other tenderness. FROM. Negative ant/post drawers. Negative valgus/varus testing. Negative lachmanns. Negative mcmurrays, apleys, patellar apprehension. NV intact distally.  Left knee: FROM with mild effusion.  No pain.   Assessment & Plan:  1. Right knee pain - with large effusion and synovitis confirmed by ultrasound.  Had aspiration and injections by prior PCP Nicky Pugh NP and fluid did not seem to indicate an inflammatory arthropathy and no crystals seen.  He did see rheum (Dr. Trudie Reed) - notes not available but Layne noted they were talking about starting DMARDs but no diagnosis given.  We discussed options - will go ahead with MRI to assess for any underlying pathology to cause persistent effusion.  Repeat aspiration/injection given today.  Icing, compression.  Can revisit rheumatology visits as well.  After informed written consent patient was lying supine on exam table.  Right knee was prepped with alcohol swab.  Utilizing superolateral approach, 3 mL of bupivicaine was used for local anesthesia.  Then using an 18g needle on multiple 60cc syringes, 165 mL of clear straw-colored fluid was aspirated from right knee.  Knee was then injected with 3:1 bupivicaine:depomedrol.  Patient tolerated procedure well without  immediate complications

## 2017-01-19 NOTE — Assessment & Plan Note (Signed)
with large effusion and synovitis confirmed by ultrasound.  Had aspiration and injections by prior PCP Nicky Pugh NP and fluid did not seem to indicate an inflammatory arthropathy and no crystals seen.  He did see rheum (Dr. Trudie Reed) - notes not available but Layne noted they were talking about starting DMARDs but no diagnosis given.  We discussed options - will go ahead with MRI to assess for any underlying pathology to cause persistent effusion.  Repeat aspiration/injection given today.  Icing, compression.  Can revisit rheumatology visits as well.  After informed written consent patient was lying supine on exam table.  Right knee was prepped with alcohol swab.  Utilizing superolateral approach, 3 mL of bupivicaine was used for local anesthesia.  Then using an 18g needle on multiple 60cc syringes, 165 mL of clear straw-colored fluid was aspirated from right knee.  Knee was then injected with 3:1 bupivicaine:depomedrol.  Patient tolerated procedure well without immediate complications

## 2017-01-20 NOTE — Addendum Note (Signed)
Addended by: Sherrie George F on: 01/20/2017 08:32 AM   Modules accepted: Orders

## 2017-02-14 ENCOUNTER — Encounter (HOSPITAL_BASED_OUTPATIENT_CLINIC_OR_DEPARTMENT_OTHER): Payer: Self-pay | Admitting: *Deleted

## 2017-02-14 ENCOUNTER — Emergency Department (HOSPITAL_BASED_OUTPATIENT_CLINIC_OR_DEPARTMENT_OTHER)
Admission: EM | Admit: 2017-02-14 | Discharge: 2017-02-14 | Disposition: A | Discharge status: Home / Self Care | Payer: BLUE CROSS/BLUE SHIELD | Attending: Emergency Medicine | Admitting: Emergency Medicine

## 2017-02-14 ENCOUNTER — Emergency Department (HOSPITAL_BASED_OUTPATIENT_CLINIC_OR_DEPARTMENT_OTHER): Payer: BLUE CROSS/BLUE SHIELD

## 2017-02-14 DIAGNOSIS — M79642 Pain in left hand: Secondary | ICD-10-CM | POA: Diagnosis not present

## 2017-02-14 DIAGNOSIS — M25542 Pain in joints of left hand: Secondary | ICD-10-CM | POA: Diagnosis not present

## 2017-02-14 DIAGNOSIS — R2232 Localized swelling, mass and lump, left upper limb: Secondary | ICD-10-CM | POA: Insufficient documentation

## 2017-02-14 DIAGNOSIS — M7989 Other specified soft tissue disorders: Secondary | ICD-10-CM | POA: Diagnosis not present

## 2017-02-14 DIAGNOSIS — I10 Essential (primary) hypertension: Secondary | ICD-10-CM | POA: Insufficient documentation

## 2017-02-14 MED ORDER — ACETAMINOPHEN 500 MG PO TABS
1000.0000 mg | ORAL_TABLET | Freq: Once | ORAL | Status: AC
  Administered 2017-02-14: 1000 mg via ORAL
  Filled 2017-02-14: qty 2

## 2017-02-14 MED ORDER — HYDROCODONE-ACETAMINOPHEN 5-325 MG PO TABS: 1.0000 | ORAL_TABLET | ORAL | 0 refills | Status: DC | PRN

## 2017-02-14 NOTE — ED Notes (Signed)
Pt c/o swelling and pain to left hand that started earlier this week.  Pt states his PCP thinks it is a cyst.  Pt came in tonight because today his hand became much more swollen and he can barely open his hand.  He states he tried icing it a few times and it has not seemed to help.  He took 600mg  of ibuprofen at 2300 tonight.

## 2017-02-14 NOTE — ED Provider Notes (Addendum)
[place] DEPT [facility] Provider Note: Georgena Spurling, MD, FACEP  CSN: [id number] MRN: [id number] ARRIVAL: 02/14/17 at [place]: [place]  Theodore Maldonado is a 46 y.o. male who has had a tender, fluctuant area to the dorsal left hand for several months. He had this evaluated by his PCP and was told it may be a ganglion cyst. He has not been referred to a hand specialist. He is here with worsening pain in that spot. The swelling has become more prominent over the last week and the pain more prominent over the last 24 hours. He rates his pain as an 8 out of 10 at its worst. Pain is worse with extension or palpation. There is no associated erythema or warmth. There was no inciting trauma. He has no numbness, weakness or functional limitation except for pain on extension of the wrist. He has been taking Advil without relief.  Consultation with the [facility] state controlled substances database reveals the patient has received no opioid prescriptions in the past year.    Past Medical History:  Diagnosis Date  . Elbow effusion    R  . Frequent headaches   . Gout    toe  . Hypertension   . Migraines     Past Surgical History:  Procedure Laterality Date  . TESTICLE TORSION REDUCTION     child    Family History  Problem Relation Age of Onset  . Hypertension Mother   . Hypertension Sister     Social History  Substance Use Topics  . Smoking status: Never Smoker  . Smokeless tobacco: Never Used  . Alcohol use Yes     Comment: occasional    Prior to Admission medications   Medication Sig Start Date End Date Taking? Authorizing Provider  losartan (COZAAR) 50 MG tablet Take 1 tablet (50 mg total) by mouth daily. 10/23/16   Mackie Pai, PA-C    Allergies Patient has no known allergies.   REVIEW OF SYSTEMS  Negative except as noted here or in the History of Present Illness.   PHYSICAL EXAMINATION    Initial Vital Signs Blood pressure (!) 157/76, pulse 78, temperature 99 F (37.2 C), temperature source Oral, resp. rate 18, height 5\' 6"  (1.676 m), weight 215 lb (97.5 kg), SpO2 99 %.  Examination General: Well-developed, well-nourished male in no acute distress; appearance consistent with age of record HENT: normocephalic; atraumatic Eyes: Normal appearance Neck: supple Heart: regular rate and rhythm Lungs: clear to auscultation bilaterally Abdomen: soft; nondistended; nontender; bowel sounds present Extremities: No bony deformity; full range of motion; pulses normal; tender, fluctuant, poorly defined mass of dorsal left hand without erythema or warmth Neurologic: Awake, alert and oriented; motor function intact in all extremities and symmetric; no facial droop Skin: Warm and dry Psychiatric: Normal mood and affect   RESULTS  Summary of this visit's results, reviewed by myself:   EKG Interpretation  Date/Time:    Ventricular Rate:    PR Interval:    QRS Duration:   QT Interval:    QTC Calculation:   R Axis:     Text Interpretation:        Laboratory Studies: No results found for this or any previous visit (from the past 24 hour(s)). Imaging Studies: Dg Hand Complete Left  Result Date: 02/14/2017 CLINICAL DATA:  Increased swelling and pain of the left hand EXAM: LEFT HAND - COMPLETE 3+ VIEW COMPARISON:  12/25/2016 FINDINGS: There is no evidence of fracture or dislocation. There is no evidence of arthropathy or other focal bone abnormality. IMPRESSION: Negative. Electronically Signed   By: Donavan Foil M.D.   On: 02/14/2017 03:31    ED COURSE  Nursing notes and initial vitals signs, including pulse oximetry, reviewed.  Vitals:   02/14/17 0155 02/14/17 0157  BP: (!) 157/76   Pulse: 78   Resp: 18   Temp: 99 F (37.2 C)   TempSrc: Oral   SpO2: 99%   Weight:  215 lb (97.5 kg)  Height:  5\' 6"  (1.676 m)   Will treat pain and refer to hand surgery.  PROCEDURES     ED DIAGNOSES     ICD-9-CM ICD-10-CM   1. Mass of left hand 782.2 R22.32        Shanon Rosser, MD 02/14/17 [place], MD 02/14/17 [phone]

## 2017-02-14 NOTE — ED Notes (Signed)
Pt verbalizes understanding of d/c instructions and denies any further needs at this time. 

## 2017-02-14 NOTE — ED Triage Notes (Signed)
c/o left hand swelling x 1 week,  Getting worse  States that he has been told that he has a cyst to back of left hand,  Denies inj

## 2017-06-27 ENCOUNTER — Other Ambulatory Visit: Payer: Self-pay | Admitting: Medical

## 2017-07-03 NOTE — Telephone Encounter (Signed)
Was advised to F/U in June/must sched appt first/thx dmf

## 2017-07-07 ENCOUNTER — Telehealth: Payer: Self-pay | Admitting: Medical

## 2017-07-07 NOTE — Telephone Encounter (Signed)
Caller name: Ormond Relationship to patient: patient Can be reached: [phone]  Pharmacy: [facility]  Reason for call: Patient is requesting refill on Losartan, has been out of pills for a couple days now

## 2017-07-08 MED ORDER — LOSARTAN POTASSIUM 50 MG PO TABS: 50.0000 mg | ORAL_TABLET | Freq: Every day | ORAL | 1 refills | Status: DC

## 2017-07-08 NOTE — Telephone Encounter (Signed)
Patient scheduled with PA for [facility] 07/17/2017

## 2017-07-08 NOTE — Telephone Encounter (Signed)
Pt due for follow up please call and schedule appointment.  

## 2017-07-17 ENCOUNTER — Ambulatory Visit: Payer: BLUE CROSS/BLUE SHIELD | Admitting: Medical

## 2017-07-17 DIAGNOSIS — Z0289 Encounter for other administrative examinations: Secondary | ICD-10-CM

## 2017-11-09 ENCOUNTER — Ambulatory Visit: Payer: Self-pay | Admitting: *Deleted

## 2017-11-09 NOTE — Telephone Encounter (Signed)
Not clear, can you confirm pt went to ED.  He should go to ED and not be seen in AM.

## 2017-11-09 NOTE — Telephone Encounter (Signed)
Patient is calling to report that he is having a new symptom of chest pain that is coming and going. It has occurred all day off and on. He denies chest heaviness,radiation, sweating, breathing problems. He is at his second job now. He wants an appointment for the morning. Appointment given- with clear instructions of signs and symptoms of heart problems and he will go to ED or call 911 if any should occur.  Reason for Disposition . [1] Chest pain lasting <= 5 minutes AND [2] NO chest pain or cardiac symptoms now(Exceptions: pains lasting a few seconds)  Answer Assessment - Initial Assessment Questions 1. LOCATION: "Where does it hurt?"       Left at top 2. RADIATION: "Does the pain go anywhere else?" (e.g., into neck, jaw, arms, back)     no 3. ONSET: "When did the chest pain begin?" (Minutes, hours or days)      Today- about 10 am 4. PATTERN "Does the pain come and go, or has it been constant since it started?"  "Does it get worse with exertion?"      Comes and goes, no- patient worked all day without making it worse 5. DURATION: "How long does it last" (e.g., seconds, minutes, hours)     5 minutes 6. SEVERITY: "How bad is the pain?"  (e.g., Scale 1-10; mild, moderate, or severe)    - MILD (1-3): doesn't interfere with normal activities     - MODERATE (4-7): interferes with normal activities or awakens from sleep    - SEVERE (8-10): excruciating pain, unable to do any normal activities       5 7. CARDIAC RISK FACTORS: "Do you have any history of heart problems or risk factors for heart disease?" (e.g., prior heart attack, angina; high blood pressure, diabetes, being overweight, high cholesterol, smoking, or strong family history of heart disease)     Blood medications, high cholesterol 8. PULMONARY RISK FACTORS: "Do you have any history of lung disease?"  (e.g., blood clots in lung, asthma, emphysema, birth control pills)     no 9. CAUSE: "What do you think is causing the chest pain?"  Unknown 10. OTHER SYMPTOMS: "Do you have any other symptoms?" (e.g., dizziness, nausea, vomiting, sweating, fever, difficulty breathing, cough)       cough 11. PREGNANCY: "Is there any chance you are pregnant?" "When was your last menstrual period?"       n/a  Protocols used: CHEST PAIN-A-AH

## 2017-11-09 NOTE — Telephone Encounter (Signed)
Attempted to notify pt. Received voicemail. Left detailed message that he should be seen in the ER tonight and not come to office for appointment in the morning. Advised him that tomorrow's appointment has been cancelled and to proceed to the ER. Placed a hold in [place] for tomorrow's appt in case he doesn't get message tonight but Provider recommendation is to go to ER tonight.

## 2017-11-09 NOTE — Telephone Encounter (Signed)
FYI. Pt headed to ED.  

## 2017-11-10 ENCOUNTER — Ambulatory Visit: Payer: BLUE CROSS/BLUE SHIELD | Admitting: Family

## 2017-11-10 ENCOUNTER — Encounter: Payer: Self-pay | Admitting: Family

## 2017-11-10 ENCOUNTER — Ambulatory Visit (INDEPENDENT_AMBULATORY_CARE_PROVIDER_SITE_OTHER): Payer: BLUE CROSS/BLUE SHIELD | Admitting: Family

## 2017-11-10 VITALS — BP 142/100 | HR 86 | Temp 98.5°F | Resp 18 | Ht 66.0 in | Wt 220.4 lb

## 2017-11-10 DIAGNOSIS — J069 Acute upper respiratory infection, unspecified: Secondary | ICD-10-CM | POA: Diagnosis not present

## 2017-11-10 DIAGNOSIS — I10 Essential (primary) hypertension: Secondary | ICD-10-CM

## 2017-11-10 DIAGNOSIS — R0789 Other chest pain: Secondary | ICD-10-CM | POA: Diagnosis not present

## 2017-11-10 DIAGNOSIS — R079 Chest pain, unspecified: Secondary | ICD-10-CM | POA: Diagnosis not present

## 2017-11-10 MED ORDER — MELOXICAM 7.5 MG PO TABS: 7.5000 mg | ORAL_TABLET | Freq: Every day | ORAL | 0 refills | Status: DC

## 2017-11-10 MED ORDER — LOSARTAN POTASSIUM 50 MG PO TABS: 50.0000 mg | ORAL_TABLET | Freq: Every day | ORAL | 1 refills | Status: DC

## 2017-11-10 NOTE — Patient Instructions (Signed)
For cold symptoms your may use coricidin HBP or plain mucinex and tylenol as needed. For chest tenderness- begin meloxicam once daily.  If new/worsening chest pain occurs go directly to the ER or call 911.

## 2017-11-10 NOTE — Progress Notes (Signed)
Subjective:    Patient ID: Theodore Maldonado, male    DOB: 1971/02/23, 47 y.o.   MRN: [id number]  HPI  Theodore Maldonado is a 47 yr old male who presents today with chief complaint of chest pain. Reports sharp pain off/on yesterday which started at 10 AM. Was located on the left side of his chest.  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  He reports some "soreness today" but no "sharp pains."  Repots that he does do some lifting in her evening job.    He reports that he has been doing a lot of coughing from a recent URI.  He denies pain with deep breath.  Has has "a little tightness" on the left side of his chest.  Denies fever.  HTN- reports good compliance with his cozzar.  He has been taking tylenol flu and theraflu.  +    Review of Systems See HPI  Past Medical History:  Diagnosis Date  . Elbow effusion    R  . Frequent headaches   . Gout    toe  . Hypertension   . Migraines      Social History   Socioeconomic History  . Marital status: Single    Spouse name: Not on file  . Number of children: 2  . Years of education: Not on file  . Highest education level: Not on file  Social Needs  . Financial resource strain: Not on file  . Food insecurity - worry: Not on file  . Food insecurity - inability: Not on file  . Transportation needs - medical: Not on file  . Transportation needs - non-medical: Not on file  Occupational History    Employer: [employer]    Comment: Banker  Tobacco Use  . Smoking status: Never Smoker  . Smokeless tobacco: Never Used  Substance and Sexual Activity  . Alcohol use: Yes    Comment: occasional  . Drug use: No  . Sexual activity: No  Other Topics Concern  . Not on file  Social History Narrative   Theodore Maldonado lives with his sister. He has two daughters. Works [employer].    Past Surgical History:  Procedure Laterality Date  . TESTICLE TORSION REDUCTION     child    Family History  Problem Relation Age  of Onset  . Hypertension Mother   . Hypertension Sister     No Known Allergies  Current Outpatient Medications on File Prior to Visit  Medication Sig Dispense Refill  . losartan (COZAAR) 50 MG tablet Take 1 tablet (50 mg total) by mouth daily. 90 tablet 1   No current facility-administered medications on file prior to visit.     BP (!) 142/100 (BP Location: Left Arm, Cuff Size: Large)   Pulse 86   Temp 98.5 F (36.9 C) (Oral)   Resp 18   Ht _0  (1.676 m)   Wt 220 lb 6.4 oz (100 kg)   SpO2 98%   BMI 35.57 kg/m       Objective:   Physical Exam  Constitutional: He is oriented to person, place, and time. He appears well-developed and well-nourished. No distress.  HENT:  Head: Normocephalic and atraumatic.  Right Ear: Tympanic membrane and ear canal normal.  Left Ear: Tympanic membrane and ear canal normal.  Mouth/Throat: No oropharyngeal exudate, posterior oropharyngeal edema or posterior oropharyngeal erythema.  Cardiovascular: Normal rate and regular rhythm.  No murmur heard. Pulmonary/Chest: Effort normal  and breath sounds normal. No respiratory distress. He has no wheezes. He has no rales.  Musculoskeletal: He exhibits no edema.  + reproducible chest pain left anterior chest  Neurological: He is alert and oriented to person, place, and time.  Skin: Skin is warm and dry.  Psychiatric: He has a normal mood and affect. His behavior is normal. Thought content normal.          Assessment & Plan:  Musculoskeletal chest pain- EKG is personally reviewed and compared to EKG from 2013.  No acute changes are noted.  Appears unchanged compared to previous. pain is likely secondary to URI/coughing.  Advised pt as follows:   For chest tenderness- begin meloxicam once daily.  If new/worsening chest pain occurs go directly to the ER or call 911.  HTN- uncontrolled. Likely secondary to otc decongestants.  Advised pt to d/c otc decongestants and instead may  Use coricidin hbp,  plain mucinex or tylenol.   URI- otc measures as above.

## 2017-11-20 ENCOUNTER — Encounter: Payer: Self-pay | Admitting: Medical

## 2017-11-20 ENCOUNTER — Ambulatory Visit (INDEPENDENT_AMBULATORY_CARE_PROVIDER_SITE_OTHER): Payer: BLUE CROSS/BLUE SHIELD | Admitting: Medical

## 2017-11-20 VITALS — BP 148/88 | HR 71 | Temp 98.2°F | Resp 16 | Ht 66.0 in | Wt 226.0 lb

## 2017-11-20 DIAGNOSIS — I10 Essential (primary) hypertension: Secondary | ICD-10-CM | POA: Diagnosis not present

## 2017-11-20 DIAGNOSIS — R0789 Other chest pain: Secondary | ICD-10-CM | POA: Diagnosis not present

## 2017-11-20 DIAGNOSIS — E785 Hyperlipidemia, unspecified: Secondary | ICD-10-CM | POA: Diagnosis not present

## 2017-11-20 MED ORDER — LOSARTAN POTASSIUM 100 MG PO TABS: 100.0000 mg | ORAL_TABLET | Freq: Every day | ORAL | 0 refills | Status: DC

## 2017-11-20 NOTE — Patient Instructions (Signed)
Your recent pain and chest wall appears to be musculoskeletal/left upper pectoralis muscle pain.  I reviewed your EKG done on the last visit and it did look okay your pain did respond to meloxicam.  Presently I want you to go ahead and start meloxicam as your blood pressure is a little high today and this might be increasing BP some.  For your blood pressure I want you to check your blood pressure daily and see if BP remains above 140/90.  If so then I want you to go ahead and increase her losartan 50 mg to losartan 100 mg.  Also I want you to go ahead and schedule for complete physical in 2-3 weeks and will get complete physical exam labs with fasting lipid panel.  We want to control cardiac risk factors and if you chest pain returns or has different features then we might refer you to cardiologist for further workup.  Follow-up in 2-3 weeks or as needed.

## 2017-11-20 NOTE — Progress Notes (Signed)
Subjective:    Patient ID: Theodore Maldonado, male    DOB: Feb 17, 1971, 47 y.o.   MRN: [id number]  HPI  Pt in here for follow up. Pt states seen last visit was in for some left upper pectoralis area pain that was happening intermittent lasting for 1-2 minutes. Pain level varied. He had no shoulder pain, no jaw pain, no arm pain, no nausea or vomiting.   Pt has been taking meloxicam for possible muscle pain. Pt has been taking daily. Pt states he had gradual decreased pain over following 4 days after starting meds. Also pain started after some coughing. No cough today. No fever.  Pt bp is 148/88 today. He has been checking bp at home. But not in a while. He can't remember last bp reading.   Pt had mild ldl in past. No MI history in family. No sudden death in young family members.     Review of Systems  Constitutional: Negative for chills, fatigue and fever.  HENT: Negative for congestion, sinus pressure and sinus pain.   Respiratory: Positive for cough. Negative for chest tightness, shortness of breath and wheezing.        Rare residual cough. Had this before transient mild chest pain.  Cardiovascular: Negative for chest pain and palpitations.  Gastrointestinal: Negative for abdominal pain.  Musculoskeletal: Negative for back pain and gait problem.  Skin: Negative for rash.  Neurological: Negative for dizziness, weakness and light-headedness.  Hematological: Negative for adenopathy. Does not bruise/bleed easily.  Psychiatric/Behavioral: Negative for behavioral problems and decreased concentration. The patient is not nervous/anxious.     Past Medical History:  Diagnosis Date  . Elbow effusion    R  . Frequent headaches   . Gout    toe  . Hypertension   . Migraines      Social History   Socioeconomic History  . Marital status: Single    Spouse name: Not on file  . Number of children: 2  . Years of education: Not on file  . Highest education level: Not on file  Social  Needs  . Financial resource strain: Not on file  . Food insecurity - worry: Not on file  . Food insecurity - inability: Not on file  . Transportation needs - medical: Not on file  . Transportation needs - non-medical: Not on file  Occupational History    Employer: [employer]    Comment: Banker  Tobacco Use  . Smoking status: Never Smoker  . Smokeless tobacco: Never Used  Substance and Sexual Activity  . Alcohol use: Yes    Comment: occasional  . Drug use: No  . Sexual activity: No  Other Topics Concern  . Not on file  Social History Narrative   Mr. Staten lives with his sister. He has two daughters. Works [employer].    Past Surgical History:  Procedure Laterality Date  . TESTICLE TORSION REDUCTION     child    Family History  Problem Relation Age of Onset  . Hypertension Mother   . Hypertension Sister     No Known Allergies  Current Outpatient Medications on File Prior to Visit  Medication Sig Dispense Refill  . losartan (COZAAR) 50 MG tablet Take 1 tablet (50 mg total) by mouth daily. 90 tablet 1  . meloxicam (MOBIC) 7.5 MG tablet Take 1 tablet (7.5 mg total) by mouth daily. 14 tablet 0   No current facility-administered medications on file prior to visit.     BP Marland Kitchen)  144/104   Pulse 71   Temp 98.2 F (36.8 C) (Oral)   Resp 16   Ht _0  (1.676 m)   Wt 226 lb (102.5 kg)   SpO2 99%   BMI 36.48 kg/m       Objective:   Physical Exam  General Mental Status- Alert. General Appearance- Not in acute distress.   Skin General: Color- Normal Color. Moisture- Normal Moisture.  Neck Carotid Arteries- Normal color. Moisture- Normal Moisture. No carotid bruits. No JVD.  Chest and Lung Exam Auscultation: Breath Sounds:-Normal.  Cardiovascular Auscultation:Rythm- Regular. Murmurs & Other Heart Sounds:Auscultation of the heart reveals- No Murmurs.  Abdomen Inspection:-Inspeection Normal. Palpation/Percussion:Note:No mass. Palpation and Percussion  of the abdomen reveal- Non Tender, Non Distended + BS, no rebound or guarding.    Neurologic Cranial Nerve exam:- CN III-XII intact(No nystagmus), symmetric smile. Strength:- 5/5 equal and symmetric strength both upper and lower extremities.  Anterior thorax- no left side chest wall tenderness to palpation. Lt upper ext- when I pull back on arm and stretch pectoralis mild faint transient pain.      Assessment & Plan:  Your recent pain and chest wall appears to be musculoskeletal/left upper pectoralis muscle pain.  I reviewed your EKG done on the last visit and it did look okay your pain did respond to meloxicam.  Presently I want you to go ahead and start meloxicam as your blood pressure is a little high today and this might be increasing BP some.  For your blood pressure I want you to check your blood pressure daily and see if BP remains above 140/90.  If so then I want you to go ahead and increase her losartan 50 mg to losartan 100 mg.  Also I want you to go ahead and schedule for complete physical in 2-3 weeks and will get complete physical exam labs with fasting lipid panel.  We want to control cardiac risk factors and if you chest pain returns or has different features then we might refer you to cardiologist for further workup.  Follow-up in 2-3 weeks or as needed.  Keyoni Lapinski, Percell Miller, PA-C

## 2017-12-11 ENCOUNTER — Ambulatory Visit (INDEPENDENT_AMBULATORY_CARE_PROVIDER_SITE_OTHER): Payer: BLUE CROSS/BLUE SHIELD | Admitting: Medical

## 2017-12-11 ENCOUNTER — Encounter: Payer: Self-pay | Admitting: Medical

## 2017-12-11 VITALS — BP 149/93 | HR 70 | Temp 97.8°F | Resp 16 | Ht 66.0 in | Wt 224.0 lb

## 2017-12-11 DIAGNOSIS — R0789 Other chest pain: Secondary | ICD-10-CM | POA: Diagnosis not present

## 2017-12-11 DIAGNOSIS — I10 Essential (primary) hypertension: Secondary | ICD-10-CM | POA: Diagnosis not present

## 2017-12-11 MED ORDER — LOSARTAN POTASSIUM 100 MG PO TABS: 100.0000 mg | ORAL_TABLET | Freq: Every day | ORAL | 2 refills | Status: DC

## 2017-12-11 NOTE — Patient Instructions (Addendum)
For high blood pressure do recommend you increase losartan to 100 mg a day. Check bp every other day. Confirm that bp less than 140/90. Try to drink less coffee. Get some daily exercise.  Chest/muscle pain resolved. If any pain returns let us know. If any severe cardiac like then ED evaluation. If mild moderate type pain try to be seen in office.  Follow up in one month(woud like to confirm bp is improving) or as needed

## 2017-12-11 NOTE — Progress Notes (Signed)
Subjective:    Patient ID: Theodore Maldonado, male    DOB: 1971/08/20, 47 y.o.   MRN: [id number]  HPI   Pt chest wall pain did go away wiith meloxicam after 3-4 days. His bp was high and was formerly on losartan 50 mg a day. He never took the 100 mg dose that heads.  I had advised not to use meloxicam due to concern for increasing his bp.  He has not checked his blood pressure at home. He describes his bp was in 140/90 range.   Pt drinks one large  Thermus  of coffee in the morning. Later in day he drinks 2 cups of coffee.  No cardiac or neurologic signs or symptoms.     Review of Systems  Constitutional: Negative for chills, fatigue and fever.  HENT: Negative for congestion, drooling, facial swelling, hearing loss and mouth sores.   Respiratory: Negative for cough, chest tightness, shortness of breath and wheezing.   Cardiovascular: Negative for chest pain and palpitations.  Gastrointestinal: Negative for abdominal pain.  Musculoskeletal: Negative for back pain and gait problem.  Neurological: Negative for dizziness, light-headedness and headaches.       Occasinional faint mild ha on and off.  No headache presently.  But after discussion with him he thinks that his blood pressure might be elevated when he has those mild headaches.  No associated neurologic signs or symptoms reported.  Hematological: Negative for adenopathy. Does not bruise/bleed easily.  Psychiatric/Behavioral: Negative for behavioral problems and confusion.    Past Medical History:  Diagnosis Date  . Elbow effusion    R  . Frequent headaches   . Gout    toe  . Hypertension   . Migraines      Social History   Socioeconomic History  . Marital status: Single    Spouse name: Not on file  . Number of children: 2  . Years of education: Not on file  . Highest education level: Not on file  Social Needs  . Financial resource strain: Not on file  . Food insecurity - worry: Not on file  . Food  insecurity - inability: Not on file  . Transportation needs - medical: Not on file  . Transportation needs - non-medical: Not on file  Occupational History    Employer: [employer]    Comment: Banker  Tobacco Use  . Smoking status: Never Smoker  . Smokeless tobacco: Never Used  Substance and Sexual Activity  . Alcohol use: Yes    Comment: occasional  . Drug use: No  . Sexual activity: No  Other Topics Concern  . Not on file  Social History Narrative   Theodore Maldonado lives with his sister. He has two daughters. Works [employer].    Past Surgical History:  Procedure Laterality Date  . TESTICLE TORSION REDUCTION     child    Family History  Problem Relation Age of Onset  . Hypertension Mother   . Hypertension Sister     No Known Allergies  Current Outpatient Medications on File Prior to Visit  Medication Sig Dispense Refill  . losartan (COZAAR) 100 MG tablet Take 1 tablet (100 mg total) by mouth daily. 30 tablet 0  . losartan (COZAAR) 50 MG tablet Take 1 tablet (50 mg total) by mouth daily. 90 tablet 1  . meloxicam (MOBIC) 7.5 MG tablet Take 1 tablet (7.5 mg total) by mouth daily. 14 tablet 0   No current facility-administered medications on file prior to  visit.     BP (!) 149/93 (BP Location: Left Arm, Patient Position: Sitting, Cuff Size: Large)   Pulse 70   Temp 97.8 F (36.6 C) (Oral)   Resp 16   Ht _0  (1.676 m)   Wt 224 lb (101.6 kg)   SpO2 99%   BMI 36.15 kg/m       Objective:   Physical Exam  General Mental Status- Alert. General Appearance- Not in acute distress.   Skin General: Color- Normal Color. Moisture- Normal Moisture.  Neck Carotid Arteries- Normal color. Moisture- Normal Moisture. No carotid bruits. No JVD.  Chest and Lung Exam Auscultation: Breath Sounds:-Normal.  Cardiovascular Auscultation:Rythm- Regular. Murmurs & Other Heart Sounds:Auscultation of the heart reveals- No Murmurs.  Abdomen Inspection:-Inspeection  Normal. Palpation/Percussion:Note:No mass. Palpation and Percussion of the abdomen reveal- Non Tender, Non Distended + BS, no rebound or guarding.   Neurologic Cranial Nerve exam:- CN III-XII intact(No nystagmus), symmetric smile. Strength:- 5/5 equal and symmetric strength both upper and lower extremities.      Assessment & Plan:  For high blood pressure do recommend you increase losartan to 100 mg a day. Check bp every other day. Confirm that bp less than 140/90. Try to drink less coffee. Get some daily exercise.  Chest/muscle pain resolved. If any pain returns let us know. If any severe cardiac like then ED evaluation. If mild moderate type pain try to be seen in office.  Follow up in one month or as needed  [employer], PA-C

## 2017-12-29 IMAGING — DX DG KNEE 3 VIEWS*R*
3 series · 3 of 3 positions shown · non-contrast
Comparison: October 09, 2013

CLINICAL DATA: Pain and swelling

EXAM:
RIGHT KNEE - 3 VIEW

[knee ap]
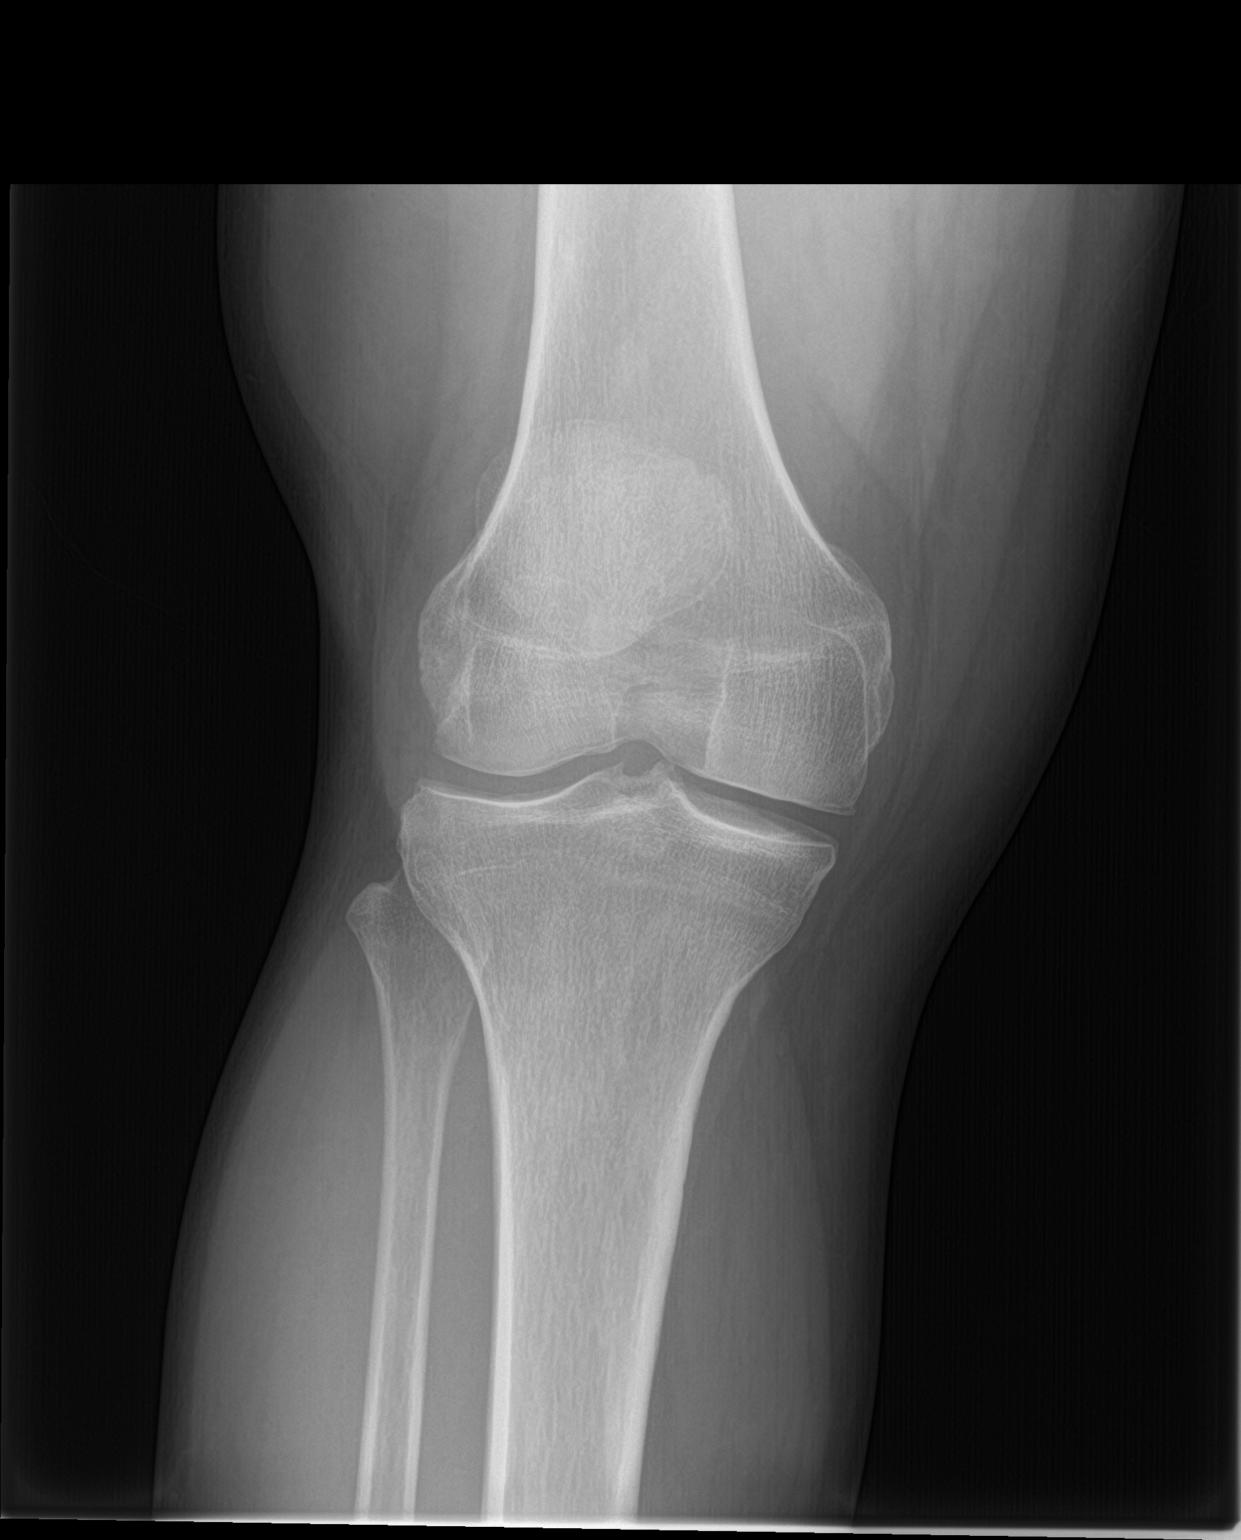

[knee lat]
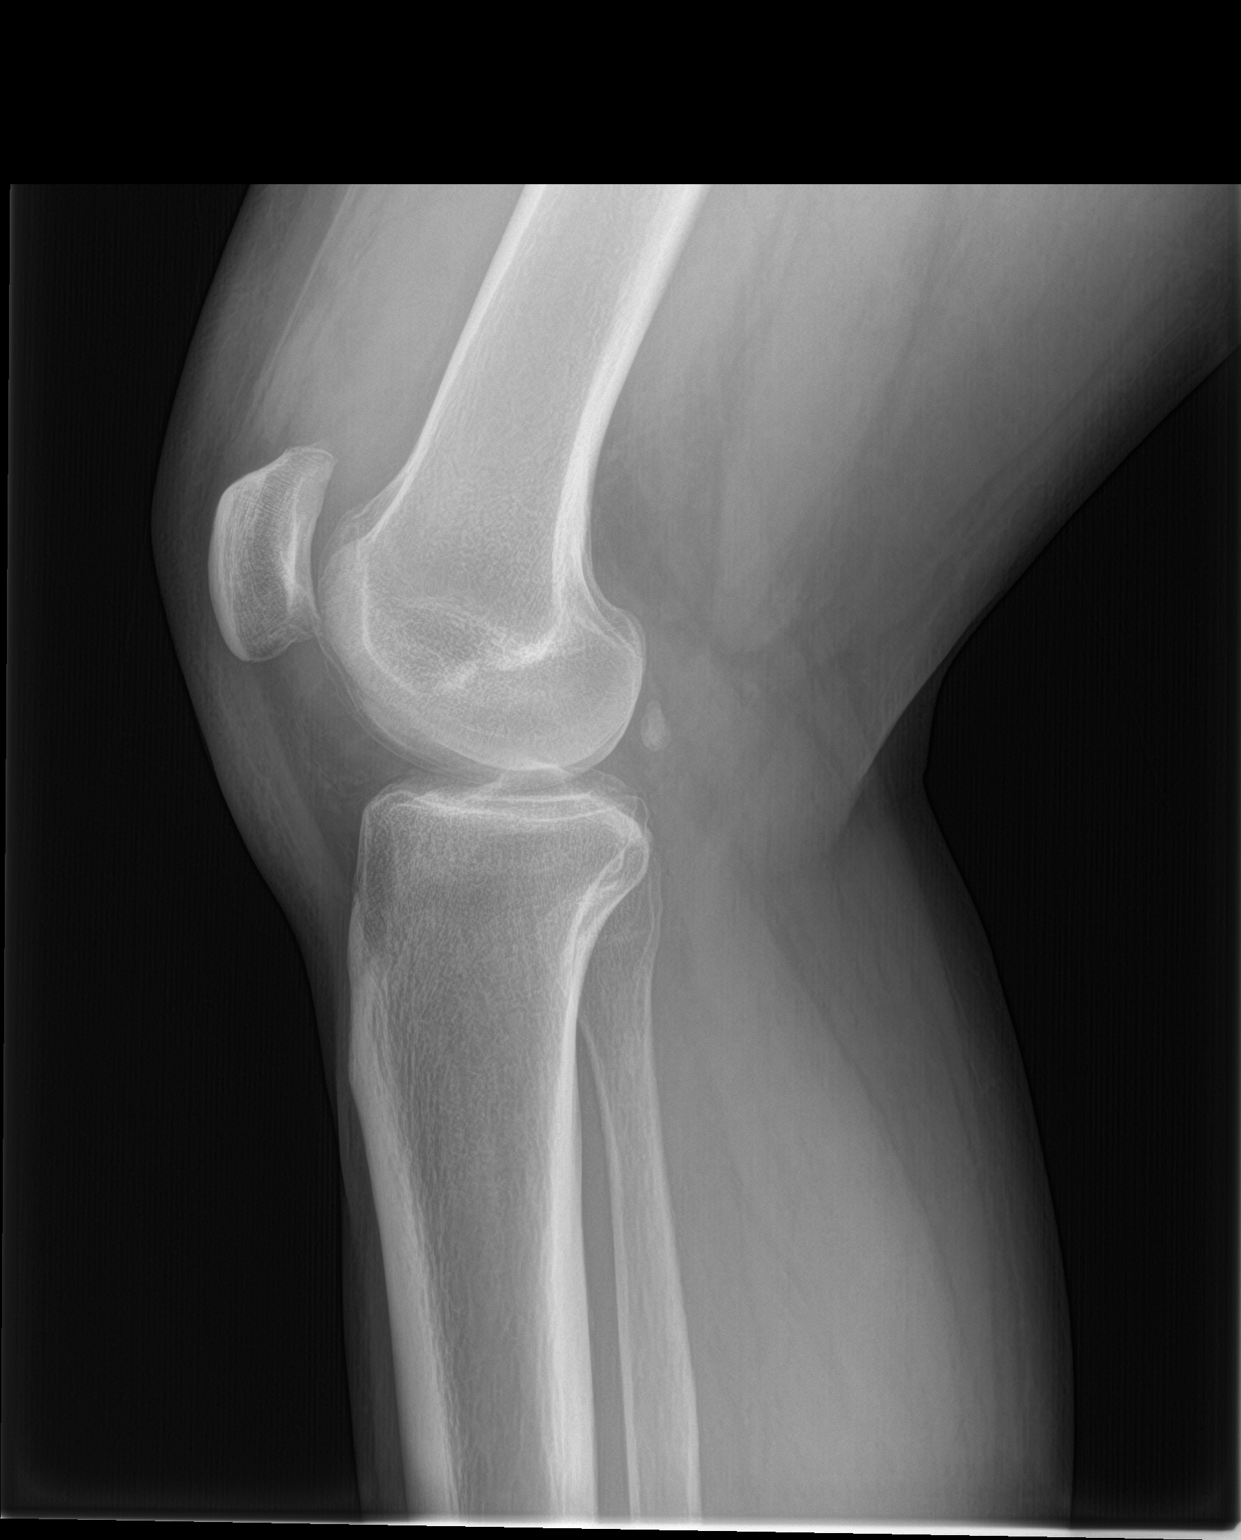

[knee sunrise]
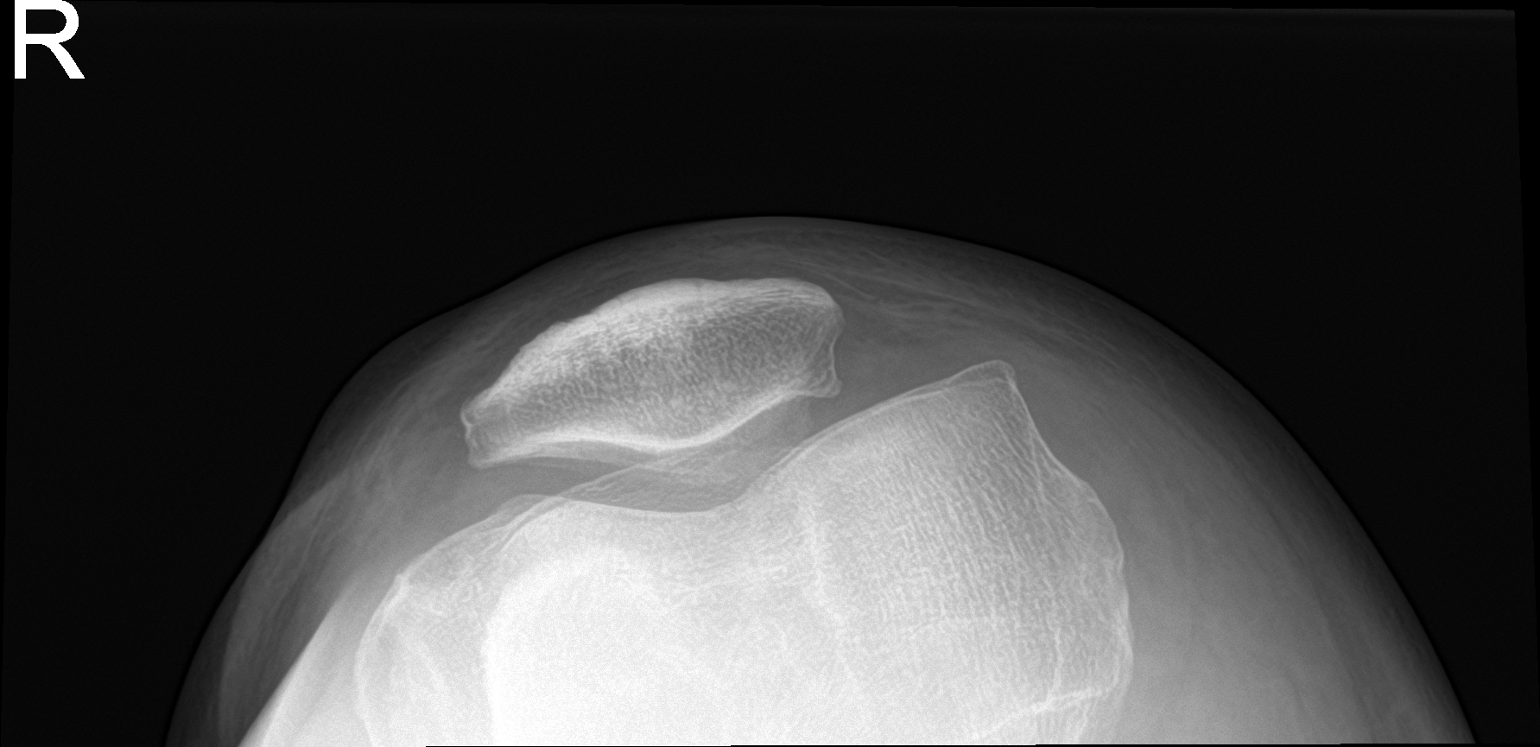

[3 of 3 positions shown; findings below may reference images not displayed]

FINDINGS: Standing frontal, standing lateral, and sunrise patellar images were
obtained. No fracture or dislocation. There is a moderate knee joint
effusion. There is no appreciable joint space narrowing. There is
slight spurring along the posterior inferior patella. No erosive
change.
IMPRESSION: Moderate joint effusion. Rather minimal osteoarthritic change. No
fracture or dislocation.

## 2018-02-18 IMAGING — CR DG HAND COMPLETE 3+V*L*
3 series · 3 of 3 positions shown · non-contrast
Comparison: 12/25/2016

CLINICAL DATA: Increased swelling and pain of the left hand

EXAM:
LEFT HAND - COMPLETE 3+ VIEW

[x hand pa left]
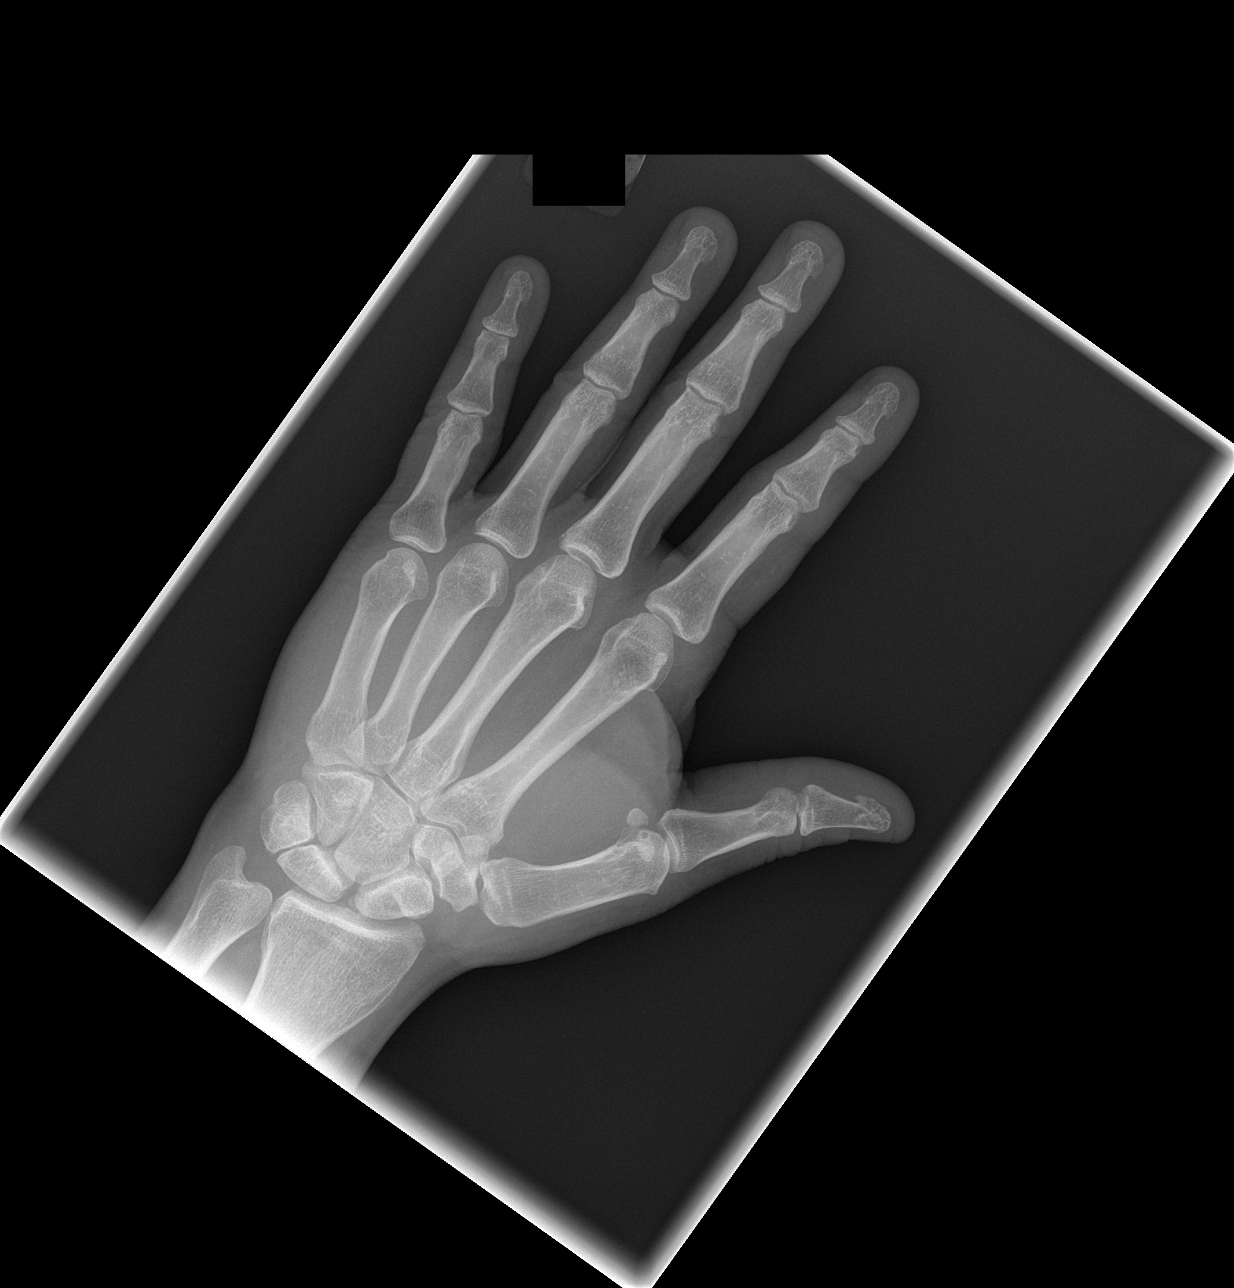

[x hand oblique left]
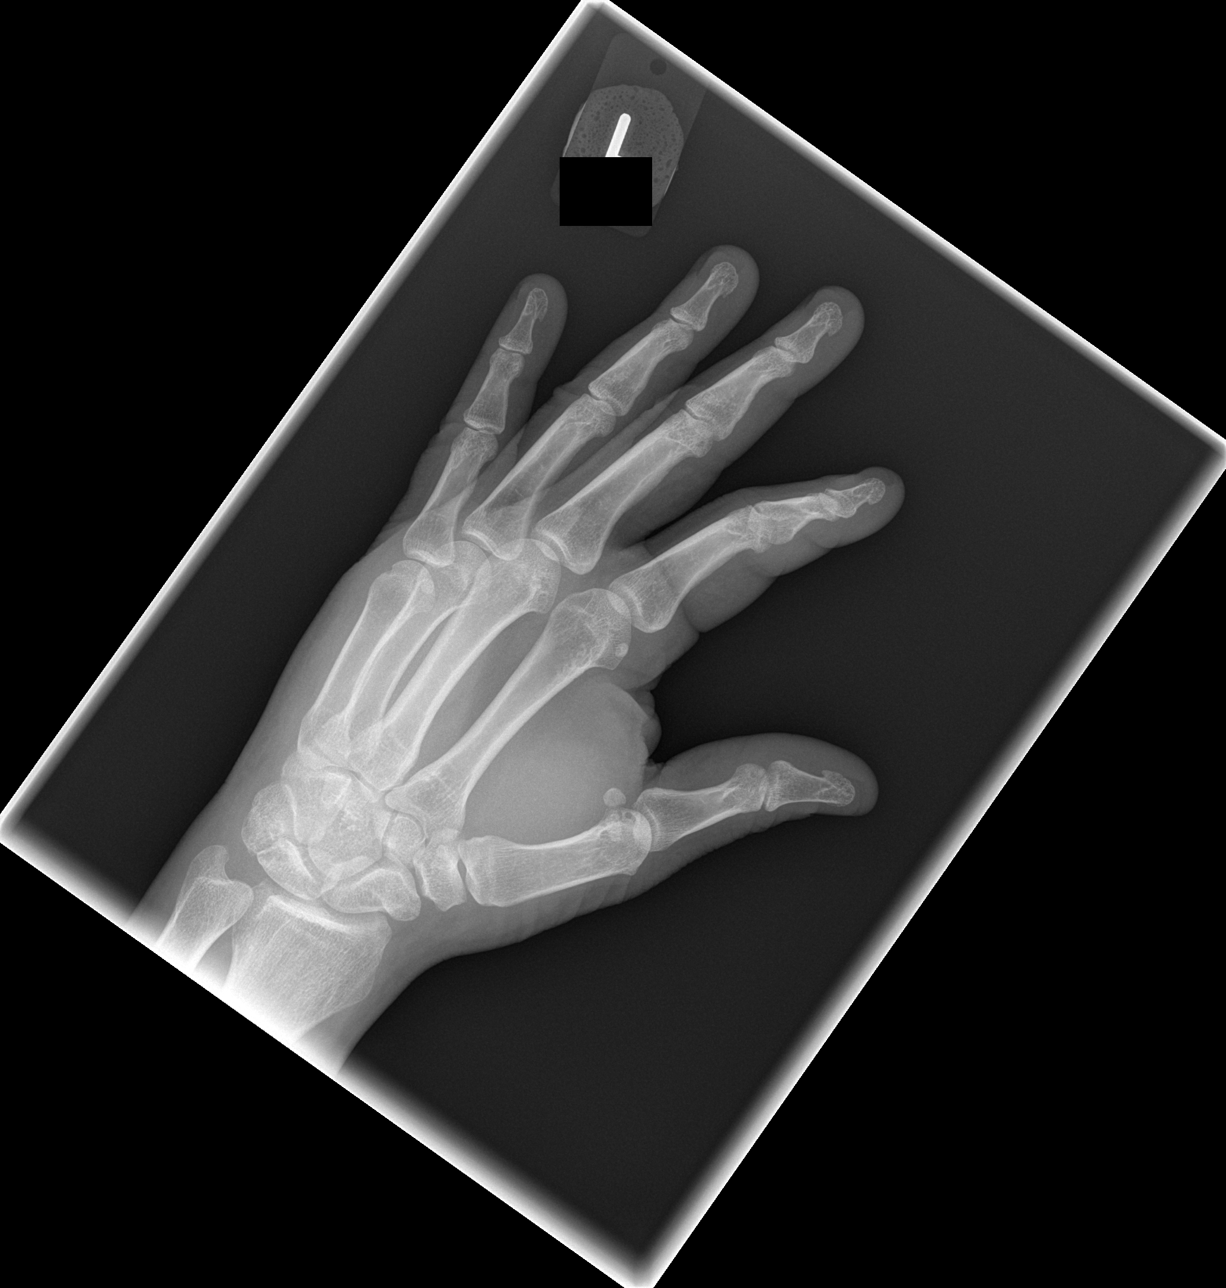

[x hand lat left]
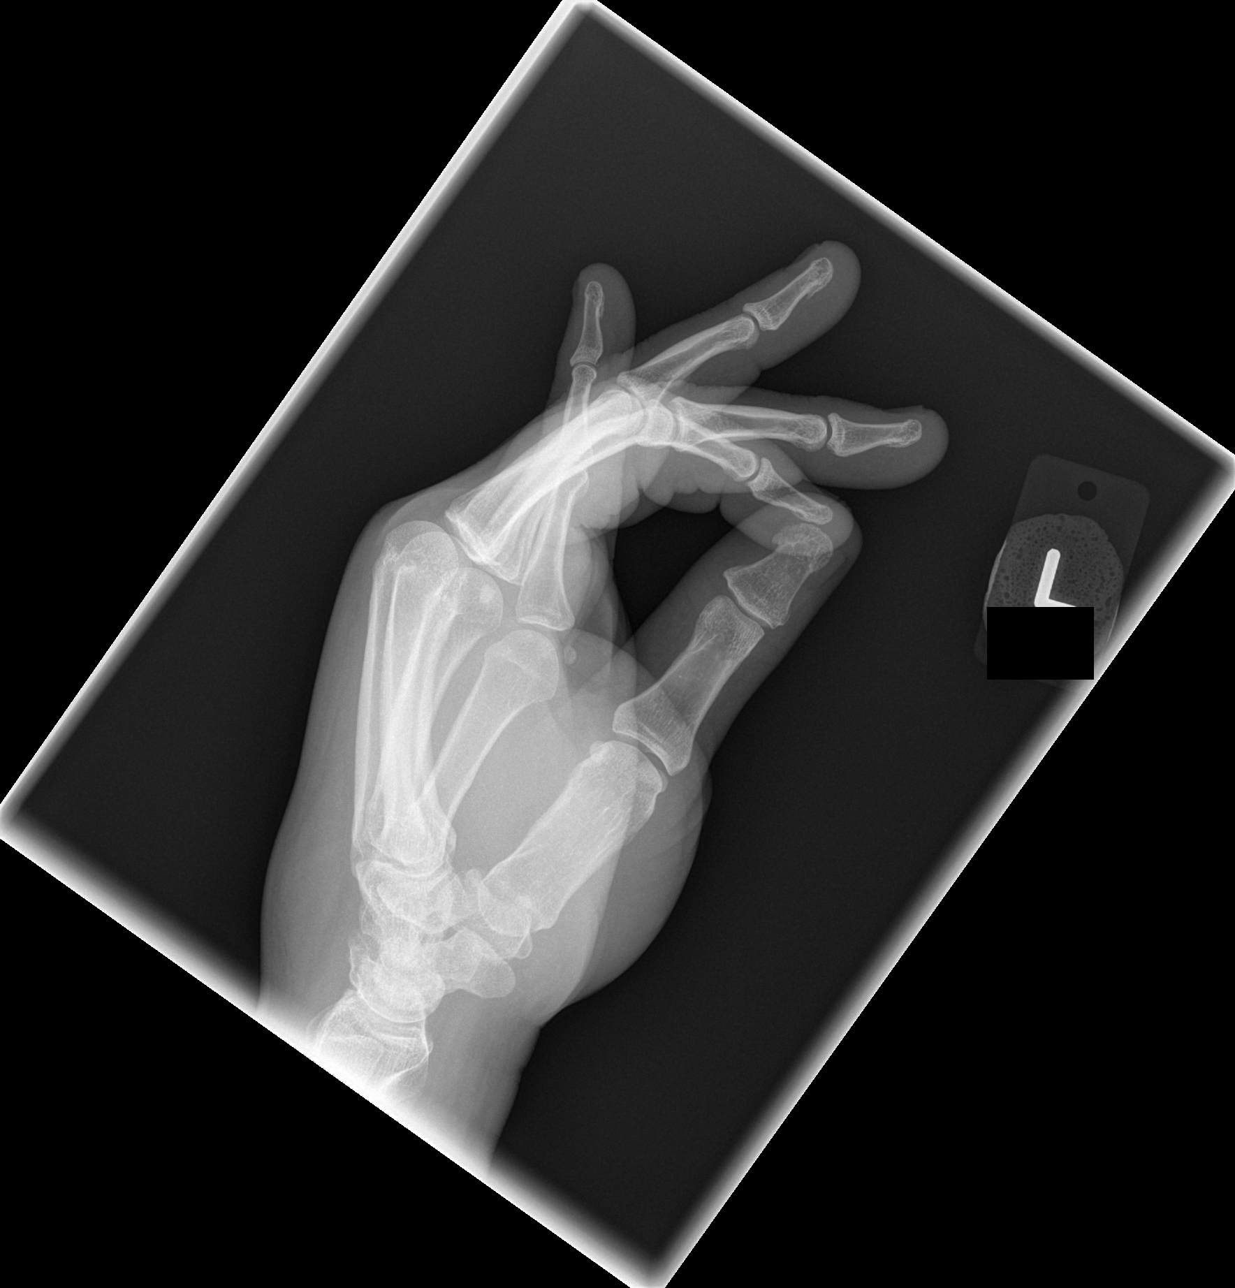

[3 of 3 positions shown; findings below may reference images not displayed]

FINDINGS: There is no evidence of fracture or dislocation. There is no
evidence of arthropathy or other focal bone abnormality.
IMPRESSION: Negative.

## 2018-06-23 ENCOUNTER — Telehealth: Payer: Self-pay | Admitting: Medical

## 2018-06-23 MED ORDER — LOSARTAN POTASSIUM 100 MG PO TABS: 100.0000 mg | ORAL_TABLET | Freq: Every day | ORAL | 0 refills | Status: DC

## 2018-06-23 NOTE — Telephone Encounter (Signed)
Copied from [place]. Topic: Quick Communication - Rx Refill/Question >> Jun 23, 2018  9:25 AM Antonieta Iba C wrote: Medication: losartan (COZAAR) 100 MG tablet   Has the patient contacted their pharmacy? No  (Agent: If no, request that the patient contact the pharmacy for the refill.) (Agent: If yes, when and what did the pharmacy advise?)  Preferred Pharmacy (with phone number or street name): [id number] [address], [place] [phone] (Phone) [phone] (Fax)    Agent: Please be advised that RX refills may take up to 3 business days. We ask that you follow-up with your pharmacy.

## 2018-07-18 ENCOUNTER — Other Ambulatory Visit: Payer: Self-pay | Admitting: Medical

## 2018-07-19 NOTE — Telephone Encounter (Signed)
Pt due for follow up please call and schedule appointment.  

## 2018-08-19 ENCOUNTER — Other Ambulatory Visit: Payer: Self-pay | Admitting: Medical

## 2018-08-19 NOTE — Telephone Encounter (Signed)
Pt scheduled for 08/27/18 @ 4pm. Pt requesting a partial refill to last until that appt if possible.

## 2018-08-19 NOTE — Telephone Encounter (Signed)
LVM for pt to call the office and schedule fu appt for future meds.

## 2018-08-19 NOTE — Telephone Encounter (Signed)
Pt is due for follow up please call and schedule appointment.  

## 2018-08-27 ENCOUNTER — Ambulatory Visit: Payer: BLUE CROSS/BLUE SHIELD | Admitting: Medical

## 2018-08-27 DIAGNOSIS — Z0289 Encounter for other administrative examinations: Secondary | ICD-10-CM

## 2018-09-02 ENCOUNTER — Encounter: Payer: Self-pay | Admitting: Medical

## 2018-09-20 ENCOUNTER — Other Ambulatory Visit: Payer: Self-pay | Admitting: Medical

## 2018-09-20 NOTE — Telephone Encounter (Signed)
Pt is due for follow up please call and schedule appointment.  

## 2018-09-21 NOTE — Telephone Encounter (Signed)
Called pt twice, phone will ring twice only and does not ring anymore and does not let me leave a message.

## 2018-10-26 ENCOUNTER — Ambulatory Visit (INDEPENDENT_AMBULATORY_CARE_PROVIDER_SITE_OTHER): Payer: Self-pay | Admitting: Medical

## 2018-10-26 ENCOUNTER — Encounter: Payer: Self-pay | Admitting: Medical

## 2018-10-26 VITALS — BP 152/98 | HR 67 | Temp 98.2°F | Resp 16 | Ht 66.0 in | Wt 233.6 lb

## 2018-10-26 DIAGNOSIS — R0683 Snoring: Secondary | ICD-10-CM

## 2018-10-26 DIAGNOSIS — I10 Essential (primary) hypertension: Secondary | ICD-10-CM

## 2018-10-26 LAB — COMPREHENSIVE METABOLIC PANEL
ALBUMIN: 4.2 g/dL (ref 3.5–5.2)
ALT: 27 U/L (ref 0–53)
AST: 19 U/L (ref 0–37)
Alkaline Phosphatase: 59 U/L (ref 39–117)
BILIRUBIN TOTAL: 0.4 mg/dL (ref 0.2–1.2)
BUN: 16 mg/dL (ref 6–23)
CALCIUM: 9.3 mg/dL (ref 8.4–10.5)
CHLORIDE: 105 meq/L (ref 96–112)
CO2: 27 meq/L (ref 19–32)
CREATININE: 1.2 mg/dL (ref 0.40–1.50)
GFR: 83.72 mL/min (ref >60.00)
Glucose, Bld: 101 mg/dL — ABNORMAL HIGH (ref 70–99)
Potassium: 4.3 mEq/L (ref 3.5–5.1)
SODIUM: 138 meq/L (ref 135–145)
Total Protein: 7.4 g/dL (ref 6.0–8.3)

## 2018-10-26 LAB — LIPID PANEL
Cholesterol: 200 mg/dL (ref 0–200)
HDL: 47.8 mg/dL (ref >39.00)
LDL Cholesterol: 126 mg/dL — ABNORMAL HIGH (ref 0–99)
NonHDL: 151.94
Total CHOL/HDL Ratio: 4
Triglycerides: 132 mg/dL (ref 0.0–149.0)
VLDL: 26.4 mg/dL (ref 0.0–40.0)

## 2018-10-26 MED ORDER — CHLORTHALIDONE 25 MG PO TABS: 25.0000 mg | ORAL_TABLET | Freq: Every day | ORAL | 3 refills | Status: DC

## 2018-10-26 NOTE — Progress Notes (Signed)
Subjective:    Patient ID: Theodore Maldonado, male    DOB: Feb 29, 1971, 47 y.o.   MRN: [id number]  HPI  Pt in for bp check up.  Pt states he has been checking his bp. He states up and down bp. He states when he checks his bp is in 140/90 range.  He wants to loose weight. He states he gained wait working second shift. He states he eats around 1 am. About 9 pounds wt gain since feb. Pt about to start back on st shift.  Pt wife states he snores. During the day feel real drowsy if not walking around/physically active. Snoring more over past year.    Review of Systems  Constitutional: Negative for chills, fatigue and fever.  HENT: Negative for congestion, ear discharge and ear pain.   Respiratory: Negative for chest tightness, shortness of breath and wheezing.   Cardiovascular: Negative for chest pain and palpitations.  Gastrointestinal: Negative for abdominal pain.  Endocrine: Negative for polydipsia, polyphagia and polyuria.  Genitourinary: Negative for dysuria and enuresis.  Musculoskeletal: Negative for arthralgias, back pain and joint swelling.  Skin: Negative for rash.  Neurological: Negative for dizziness, weakness, numbness and headaches.  Hematological: Negative for adenopathy. Does not bruise/bleed easily.  Psychiatric/Behavioral: Negative for behavioral problems and confusion.    Past Medical History:  Diagnosis Date  . Elbow effusion    R  . Frequent headaches   . Gout    toe  . Hypertension   . Migraines      Social History   Socioeconomic History  . Marital status: Single    Spouse name: Not on file  . Number of children: 2  . Years of education: Not on file  . Highest education level: Not on file  Occupational History    Employer: [employer]    Comment: Banker  Social Needs  . Financial resource strain: Not on file  . Food insecurity:    Worry: Not on file    Inability: Not on file  . Transportation needs:    Medical: Not on file   Non-medical: Not on file  Tobacco Use  . Smoking status: Never Smoker  . Smokeless tobacco: Never Used  Substance and Sexual Activity  . Alcohol use: Yes    Comment: occasional  . Drug use: No  . Sexual activity: Never  Lifestyle  . Physical activity:    Days per week: Not on file    Minutes per session: Not on file  . Stress: Not on file  Relationships  . Social connections:    Talks on phone: Not on file    Gets together: Not on file    Attends religious service: Not on file    Active member of club or organization: Not on file    Attends meetings of clubs or organizations: Not on file    Relationship status: Not on file  . Intimate partner violence:    Fear of current or ex partner: Not on file    Emotionally abused: Not on file    Physically abused: Not on file    Forced sexual activity: Not on file  Other Topics Concern  . Not on file  Social History Narrative   Mr. Brueggemann lives with his sister. He has two daughters. Works [employer].    Past Surgical History:  Procedure Laterality Date  . TESTICLE TORSION REDUCTION     child    Family History  Problem Relation Age of Onset  .  Hypertension Mother   . Hypertension Sister     No Known Allergies  Current Outpatient Medications on File Prior to Visit  Medication Sig Dispense Refill  . losartan (COZAAR) 100 MG tablet TAKE 1 TABLET BY MOUTH EVERY DAY 30 tablet 0  . meloxicam (MOBIC) 7.5 MG tablet Take 1 tablet (7.5 mg total) by mouth daily. 14 tablet 0   No current facility-administered medications on file prior to visit.     BP (!) 152/98   Pulse 67   Temp 98.2 F (36.8 C) (Oral)   Resp 16   Ht _0  (1.676 m)   Wt 233 lb 9.6 oz (106 kg)   SpO2 99%   BMI 37.70 kg/m       Objective:   Physical Exam  General Mental Status- Alert. General Appearance- Not in acute distress.   Skin General: Color- Normal Color. Moisture- Normal Moisture.  Neck Carotid Arteries- Normal color. Moisture- Normal Moisture.  No carotid bruits. No JVD.  Chest and Lung Exam Auscultation: Breath Sounds:-Normal.  Cardiovascular Auscultation:Rythm- Regular. Murmurs & Other Heart Sounds:Auscultation of the heart reveals- No Murmurs.  Abdomen Inspection:-Inspeection Normal. Palpation/Percussion:Note:No mass. Palpation and Percussion of the abdomen reveal- Non Tender, Non Distended + BS, no rebound or guarding.  Neurologic Cranial Nerve exam:- CN III-XII intact(No nystagmus), symmetric smile. Strength:- 5/5 equal and symmetric strength both upper and lower extremities.  Lower ext- no pedal edema.      Assessment & Plan:  Your blood pressure is elevated more than we want. Want bp to be closer to 130/80. So adding diuretic to your current bp med.(stress high k diet)  Advise diet and exercise. Weight loss would be beneficial for overall health and decrease bp.  For snoring refer for probable work up sleep apnea.  Follow up in 2-3 weeks or as needed  [employer], PA-C

## 2018-10-26 NOTE — Patient Instructions (Signed)
Your blood pressure is elevated more than we want. Want bp to be closer to 130/80. So adding diuretic to your current bp med.(stress high k diet)  Advise diet and exercise. Weight loss would be beneficial for overall health and decrease bp.  For snoring refer for probable work up sleep apnea.  Follow up in 2-3 weeks or as needed

## 2018-10-27 ENCOUNTER — Telehealth: Payer: Self-pay | Admitting: Medical

## 2018-10-27 MED ORDER — ATORVASTATIN CALCIUM 10 MG PO TABS: 10.0000 mg | ORAL_TABLET | Freq: Every day | ORAL | 3 refills | Status: DC

## 2018-10-27 NOTE — Telephone Encounter (Signed)
Rx atorvastatin sent to pt pharmacy. 

## 2018-11-17 ENCOUNTER — Other Ambulatory Visit: Payer: Self-pay | Admitting: Medical

## 2018-11-17 MED ORDER — LOSARTAN POTASSIUM 100 MG PO TABS: 100.0000 mg | ORAL_TABLET | Freq: Every day | ORAL | 0 refills | Status: DC

## 2018-11-17 NOTE — Telephone Encounter (Signed)
Copied from [place] [id number]. Topic: Quick Communication - Rx Refill/Question >> Nov 17, 2018 10:35 AM Sheran Luz wrote: Medication: losartan (COZAAR) 100 MG tablet  Patient is requesting a refill of this medication- requesting a 90 day supply   Preferred Pharmacy (with phone number or street name):[id number] [address], [place] [phone] (Phone) [phone] (Fax)

## 2018-11-19 ENCOUNTER — Ambulatory Visit: Payer: Self-pay | Admitting: Medical

## 2018-12-03 ENCOUNTER — Ambulatory Visit: Payer: Self-pay | Admitting: Medical

## 2018-12-03 DIAGNOSIS — Z0289 Encounter for other administrative examinations: Secondary | ICD-10-CM

## 2018-12-17 ENCOUNTER — Institutional Professional Consult (permissible substitution): Payer: Self-pay | Admitting: Pulmonary Disease

## 2019-01-22 ENCOUNTER — Other Ambulatory Visit: Payer: Self-pay | Admitting: Medical

## 2019-03-14 ENCOUNTER — Encounter: Payer: Self-pay | Admitting: Internal Medicine

## 2019-03-14 ENCOUNTER — Other Ambulatory Visit: Payer: Self-pay

## 2019-03-14 ENCOUNTER — Ambulatory Visit (INDEPENDENT_AMBULATORY_CARE_PROVIDER_SITE_OTHER): Payer: Self-pay | Admitting: Internal Medicine

## 2019-03-14 VITALS — BP 130/84 | HR 75 | Ht 66.0 in | Wt 228.8 lb

## 2019-03-14 DIAGNOSIS — R0683 Snoring: Secondary | ICD-10-CM

## 2019-03-14 DIAGNOSIS — J302 Other seasonal allergic rhinitis: Secondary | ICD-10-CM | POA: Insufficient documentation

## 2019-03-14 DIAGNOSIS — J3089 Other allergic rhinitis: Secondary | ICD-10-CM

## 2019-03-14 DIAGNOSIS — G4733 Obstructive sleep apnea (adult) (pediatric): Secondary | ICD-10-CM

## 2019-03-14 NOTE — Assessment & Plan Note (Signed)
He minimizes this as occasional sneezing and denies interference with sleep.

## 2019-03-14 NOTE — Assessment & Plan Note (Signed)
High probability he has significant OSA. Appropriate discussion of sleep hygiene, OSA, medical concerns, driving responsibility, treatment. Plan- ordering home sleep test. He would probably be a good CPAP client.

## 2019-03-14 NOTE — Patient Instructions (Signed)
Order- please schedule unattended home sleep test    Dx OSA  Please call me about 2 weeks after your sleep test, for results and recommendations. If appropriate, we may be able to start treatment before we see you next.

## 2019-03-14 NOTE — Progress Notes (Signed)
03/14/2019- 67 yoM never smoker for sleep evaluation. Medical problem list includes HBP, Inflammatory arthritis, Obesity, elevated liver enzymes Epworth score 17 -----referred by Mackie Pai , PA-C(PCP) for snoring, never been on CPAP, never had sleep test done Body weight today 228 lbs Wife has recorded his snoring. Admits daytime sleepiness if sitting quietly,but denies drowsiness with driving or any other activity. Discussed driving safety.  Coffee- 2-3 cups, No sleep meds. No ENT surgery, heart or lung problems. Weight gain 20 lbs last 2 years.  Admits some sleep paralysis in the past, but never hypnagogic hallucination or cataplexy.   Prior to Admission medications   Medication Sig Start Date End Date Taking? Authorizing Provider  losartan (COZAAR) 100 MG tablet TAKE 1 TABLET BY MOUTH EVERY DAY 01/24/19  Yes Saguier, Percell Miller, PA-C  atorvastatin (LIPITOR) 10 MG tablet Take 1 tablet (10 mg total) by mouth daily. Patient not taking: Reported on 03/14/2019 10/27/18   Saguier, Percell Miller, PA-C  chlorthalidone (HYGROTON) 25 MG tablet Take 1 tablet (25 mg total) by mouth daily. Patient not taking: Reported on 03/14/2019 10/26/18   Saguier, Percell Miller, PA-C  meloxicam (MOBIC) 7.5 MG tablet Take 1 tablet (7.5 mg total) by mouth daily. Patient not taking: Reported on 03/14/2019 11/10/17   Debbrah Alar, NP   Past Medical History:  Diagnosis Date  . Elbow effusion    R  . Frequent headaches   . Gout    toe  . Hypertension   . Migraines    Past Surgical History:  Procedure Laterality Date  . TESTICLE TORSION REDUCTION     child   Family History  Problem Relation Age of Onset  . Hypertension Mother   . Hypertension Sister    Social History   Socioeconomic History  . Marital status: Single    Spouse name: Not on file  . Number of children: 2  . Years of education: Not on file  . Highest education level: Not on file  Occupational History    Employer: [employer]    Comment: Research officer, trade union  Social Needs  . Financial resource strain: Not on file  . Food insecurity:    Worry: Not on file    Inability: Not on file  . Transportation needs:    Medical: Not on file    Non-medical: Not on file  Tobacco Use  . Smoking status: Never Smoker  . Smokeless tobacco: Never Used  Substance and Sexual Activity  . Alcohol use: Yes    Comment: occasional  . Drug use: No  . Sexual activity: Never  Lifestyle  . Physical activity:    Days per week: Not on file    Minutes per session: Not on file  . Stress: Not on file  Relationships  . Social connections:    Talks on phone: Not on file    Gets together: Not on file    Attends religious service: Not on file    Active member of club or organization: Not on file    Attends meetings of clubs or organizations: Not on file    Relationship status: Not on file  . Intimate partner violence:    Fear of current or ex partner: Not on file    Emotionally abused: Not on file    Physically abused: Not on file    Forced sexual activity: Not on file  Other Topics Concern  . Not on file  Social History Narrative   Mr. Borntreger lives with his sister. He has two daughters.  Works FT.   ROS-see HPI   + = positive Constitutional:    weight loss, night sweats, fevers, chills,+ fatigue, lassitude. HEENT:    headaches, difficulty swallowing, tooth/dental problems, sore throat,       sneezing, itching, ear ache, nasal congestion, post nasal drip, snoring CV:    chest pain, orthopnea, PND, swelling in lower extremities, anasarca,                                  dizziness, palpitations Resp:   shortness of breath with exertion or at rest.                productive cough,   non-productive cough, coughing up of blood.              change in color of mucus.  wheezing.   Skin:    rash or lesions. GI:  No-   heartburn, indigestion, abdominal pain, nausea, vomiting, diarrhea,                 change in bowel habits, loss of appetite GU: dysuria,  change in color of urine, no urgency or frequency.   flank pain. MS:   joint pain, stiffness, decreased range of motion, back pain. Neuro-     nothing unusual Psych:  change in mood or affect.  depression or anxiety.   memory loss.  OBJ- Physical Exam General- Alert, Oriented, Affect-appropriate, Distress- none acute, +Overweight Skin- rash-none, lesions- none, excoriation- none Lymphadenopathy- none Head- atraumatic            Eyes- Gross vision intact, PERRLA, conjunctivae and secretions clear            Ears- Hearing, canals-normal            Nose- Clear, no-Septal dev, mucus, polyps, erosion, perforation             Throat- Mallampati VI , mucosa clear , drainage- none, tonsils- atrophic Neck- flexible , trachea midline, no stridor , thyroid nl, carotid no bruit Chest - symmetrical excursion , unlabored           Heart/CV- RRR , no murmur , no gallop  , no rub, nl s1 s2                           - JVD- none , edema- none, stasis changes- none, varices- none           Lung- clear to P&A, wheeze- none, cough- none , dullness-none, rub- none           Chest wall-  Abd-  Br/ Gen/ Rectal- Not done, not indicated Extrem- cyanosis- none, clubbing, none, atrophy- none, strength- nl Neuro- grossly intact to observation

## 2019-03-24 ENCOUNTER — Other Ambulatory Visit: Payer: Self-pay

## 2019-03-24 ENCOUNTER — Ambulatory Visit: Payer: BLUE CROSS/BLUE SHIELD

## 2019-03-24 DIAGNOSIS — G4733 Obstructive sleep apnea (adult) (pediatric): Secondary | ICD-10-CM

## 2019-03-25 DIAGNOSIS — J9601 Acute respiratory failure with hypoxia: Secondary | ICD-10-CM | POA: Diagnosis not present

## 2019-03-30 DIAGNOSIS — G4733 Obstructive sleep apnea (adult) (pediatric): Secondary | ICD-10-CM | POA: Diagnosis not present

## 2019-04-22 ENCOUNTER — Other Ambulatory Visit: Payer: Self-pay | Admitting: Medical

## 2019-04-27 ENCOUNTER — Other Ambulatory Visit: Payer: Self-pay | Admitting: Medical

## 2019-04-27 MED ORDER — LOSARTAN POTASSIUM 50 MG PO TABS: ORAL_TABLET | ORAL | 3 refills | Status: DC

## 2019-04-27 NOTE — Telephone Encounter (Signed)
rx losartan sent to pt pharmacy. 

## 2019-06-06 ENCOUNTER — Institutional Professional Consult (permissible substitution): Payer: Self-pay | Admitting: Internal Medicine

## 2019-06-14 ENCOUNTER — Ambulatory Visit: Payer: Self-pay | Admitting: Pulmonary Disease

## 2019-11-08 DIAGNOSIS — Z6828 Body mass index (BMI) 28.0-28.9, adult: Secondary | ICD-10-CM | POA: Diagnosis not present

## 2019-11-08 DIAGNOSIS — Z20828 Contact with and (suspected) exposure to other viral communicable diseases: Secondary | ICD-10-CM | POA: Diagnosis not present

## 2019-11-15 ENCOUNTER — Ambulatory Visit: Payer: BC Managed Care – PPO | Attending: Internal Medicine

## 2019-11-15 DIAGNOSIS — Z20822 Contact with and (suspected) exposure to covid-19: Secondary | ICD-10-CM

## 2019-11-16 LAB — NOVEL CORONAVIRUS, NAA: SARS-CoV-2, NAA: NOT DETECTED

## 2019-11-21 ENCOUNTER — Ambulatory Visit: Payer: BC Managed Care – PPO | Attending: Internal Medicine

## 2019-11-21 DIAGNOSIS — Z20822 Contact with and (suspected) exposure to covid-19: Secondary | ICD-10-CM

## 2019-11-22 ENCOUNTER — Encounter: Payer: Self-pay | Admitting: *Deleted

## 2019-11-22 LAB — NOVEL CORONAVIRUS, NAA: SARS-CoV-2, NAA: DETECTED — AB

## 2019-11-23 ENCOUNTER — Telehealth (HOSPITAL_COMMUNITY): Payer: Self-pay | Admitting: Nurse Practitioner

## 2019-11-23 ENCOUNTER — Encounter: Payer: Self-pay | Admitting: Nurse Practitioner

## 2019-11-23 ENCOUNTER — Other Ambulatory Visit (HOSPITAL_COMMUNITY): Payer: Self-pay | Admitting: Nurse Practitioner

## 2019-11-23 DIAGNOSIS — U071 COVID-19: Secondary | ICD-10-CM

## 2019-11-23 NOTE — Progress Notes (Signed)
  I connected by phone with Theodore Maldonado on 11/23/2019 at 7:02 PM to discuss the potential use of an new treatment for mild to moderate COVID-19 viral infection in non-hospitalized patients.  This patient is a 49 y.o. male that meets the FDA criteria for Emergency Use Authorization of bamlanivimab or casirivimab\imdevimab.  Has a (+) direct SARS-CoV-2 viral test result  Has mild or moderate COVID-19   Is ? 48 years of age and weighs ? 40 kg  Is NOT hospitalized due to COVID-19  Is NOT requiring oxygen therapy or requiring an increase in baseline oxygen flow rate due to COVID-19  Is within 10 days of symptom onset  Has at least one of the high risk factor(s) for progression to severe COVID-19 and/or hospitalization as defined in EUA.  Specific high risk criteria : BMI >/= 35   I have spoken and communicated the following to the patient or parent/caregiver:  1. FDA has authorized the emergency use of bamlanivimab and casirivimab\imdevimab for the treatment of mild to moderate COVID-19 in adults and pediatric patients with positive results of direct SARS-CoV-2 viral testing who are 25 years of age and older weighing at least 40 kg, and who are at high risk for progressing to severe COVID-19 and/or hospitalization.  2. The significant known and potential risks and benefits of bamlanivimab and casirivimab\imdevimab, and the extent to which such potential risks and benefits are unknown.  3. Information on available alternative treatments and the risks and benefits of those alternatives, including clinical trials.  4. Patients treated with bamlanivimab and casirivimab\imdevimab should continue to self-isolate and use infection control measures (e.g., wear mask, isolate, social distance, avoid sharing personal items, clean and disinfect "high touch" surfaces, and frequent handwashing) according to CDC guidelines.   5. The patient or parent/caregiver has the option to accept or refuse  bamlanivimab or casirivimab\imdevimab .  After reviewing this information with the patient, The patient agreed to proceed with receiving the casirivimab\imdevimab infusion and will be provided a copy of the Fact sheet prior to receiving the infusion.[name] 11/23/2019 7:02 PM

## 2019-11-23 NOTE — Telephone Encounter (Signed)
Contacted patient regarding recent covid positive test. Screened for MAB therapy. See orders encounter.

## 2019-11-24 ENCOUNTER — Telehealth: Payer: Self-pay | Admitting: Medical

## 2019-11-24 ENCOUNTER — Telehealth: Payer: Self-pay

## 2019-11-24 MED ORDER — MECLIZINE HCL 12.5 MG PO TABS: 12.5000 mg | ORAL_TABLET | Freq: Three times a day (TID) | ORAL | 0 refills | Status: DC | PRN

## 2019-11-24 NOTE — Telephone Encounter (Signed)
Pt sent back over from Triage to schedule an appt. No openings for today. Appointment for 11/25/19. Patient still wants to know what he can do for the Dizziness today.  Please Advise

## 2019-11-24 NOTE — Telephone Encounter (Signed)
Called patient back and he stated he was feeling very lightheaded and dizzy. He stated he checked his temp 97. Temporal. Advised if he had a cuff to check his vitals he stated he was going to get wife to bring it and give [place] a call back today with vitals due to not wanting to go to ed.

## 2019-11-24 NOTE — Telephone Encounter (Signed)
Patient called back and stated that his bp was 134/88 first time he took it and 138/88 the second time he took it. 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

## 2019-11-24 NOTE — Telephone Encounter (Signed)
Rx meclizine sent pending appointment tomorrow.

## 2019-11-24 NOTE — Telephone Encounter (Signed)
Call was transferred for patient to be triaged regarding symptoms of dizziness with Covid 19 positive

## 2019-11-24 NOTE — Telephone Encounter (Signed)
Caller Name: self Phone: [phone]  Pt called stating he was dx with COVID-19 1/26 and is having intermittent dizziness. Pt notes staying hydrated. Per Tiffany T. Transferred pt to Shanon Brow, pt coordinator, with [facility].

## 2019-11-25 ENCOUNTER — Ambulatory Visit (INDEPENDENT_AMBULATORY_CARE_PROVIDER_SITE_OTHER): Payer: BC Managed Care – PPO | Admitting: Medical

## 2019-11-25 ENCOUNTER — Encounter: Payer: Self-pay | Admitting: Medical

## 2019-11-25 ENCOUNTER — Other Ambulatory Visit: Payer: Self-pay

## 2019-11-25 ENCOUNTER — Ambulatory Visit (HOSPITAL_COMMUNITY)
Admission: RE | Admit: 2019-11-25 | Discharge: 2019-11-25 | Disposition: A | Discharge status: Home / Self Care | Payer: BC Managed Care – PPO | Source: Ambulatory Visit | Attending: Pulmonary Disease | Admitting: Pulmonary Disease

## 2019-11-25 VITALS — BP 150/103 | Temp 97.9°F | Ht 66.0 in | Wt 223.0 lb

## 2019-11-25 DIAGNOSIS — R0981 Nasal congestion: Secondary | ICD-10-CM | POA: Diagnosis not present

## 2019-11-25 DIAGNOSIS — R05 Cough: Secondary | ICD-10-CM | POA: Diagnosis not present

## 2019-11-25 DIAGNOSIS — R059 Cough, unspecified: Secondary | ICD-10-CM

## 2019-11-25 DIAGNOSIS — U071 COVID-19: Secondary | ICD-10-CM

## 2019-11-25 DIAGNOSIS — Z23 Encounter for immunization: Secondary | ICD-10-CM | POA: Diagnosis not present

## 2019-11-25 MED ORDER — DIPHENHYDRAMINE HCL 50 MG/ML IJ SOLN: 50.0000 mg | Freq: Once | INTRAMUSCULAR | Status: DC | PRN

## 2019-11-25 MED ORDER — ALBUTEROL SULFATE HFA 108 (90 BASE) MCG/ACT IN AERS: 2.0000 | INHALATION_SPRAY | Freq: Once | RESPIRATORY_TRACT | Status: DC | PRN

## 2019-11-25 MED ORDER — METHYLPREDNISOLONE SODIUM SUCC 125 MG IJ SOLR: 125.0000 mg | Freq: Once | INTRAMUSCULAR | Status: DC | PRN

## 2019-11-25 MED ORDER — SODIUM CHLORIDE 0.9 % IV SOLN
INTRAVENOUS | Status: DC | PRN
  Administered 2019-11-25: 250 mL via INTRAVENOUS

## 2019-11-25 MED ORDER — FLUTICASONE PROPIONATE 50 MCG/ACT NA SUSP: 2.0000 | Freq: Every day | NASAL | 1 refills | Status: DC

## 2019-11-25 MED ORDER — BENZONATATE 100 MG PO CAPS: 100.0000 mg | ORAL_CAPSULE | Freq: Three times a day (TID) | ORAL | 0 refills | Status: DC | PRN

## 2019-11-25 MED ORDER — FAMOTIDINE IN NACL 20-0.9 MG/50ML-% IV SOLN: 20.0000 mg | Freq: Once | INTRAVENOUS | Status: DC | PRN

## 2019-11-25 MED ORDER — AZITHROMYCIN 250 MG PO TABS: ORAL_TABLET | ORAL | 0 refills | Status: DC

## 2019-11-25 MED ORDER — EPINEPHRINE 0.3 MG/0.3ML IJ SOAJ: 0.3000 mg | Freq: Once | INTRAMUSCULAR | Status: DC | PRN

## 2019-11-25 MED ORDER — SODIUM CHLORIDE 0.9 % IV SOLN
Freq: Once | INTRAVENOUS | Status: AC
  Filled 2019-11-25: qty 10

## 2019-11-25 NOTE — Progress Notes (Signed)
  Diagnosis: COVID-19  Physician: Dr. Joya Gaskins  Procedure: Covid Infusion [facility] Med: casirivimab\imdevimab infusion - Provided patient with casirivimab\imdevimab fact sheet for patients, parents and caregivers prior to infusion.  Complications: No immediate complications noted.  Discharge: Discharged home   Babs Sciara 11/25/2019

## 2019-11-25 NOTE — Progress Notes (Signed)
   Subjective:    Patient ID: Theodore Maldonado, male    DOB: 08-01-1971, 49 y.o.   MRN: [id number]  HPI  Virtual Visit via Video Note  I connected with Theodore Maldonado on 11/25/19 at  8:40 AM EST by a video enabled telemedicine application and verified that I am speaking with the correct person using two identifiers.  Location: Patient: home Provider: home   I discussed the limitations of evaluation and management by telemedicine and the availability of in person appointments. The patient expressed understanding and agreed to proceed.  History of Present Illness:   Pt recent dx with covid. He states recent dizziness off and on for 2 days but that has gone this morning. No associated motor or sensory deficits. He stopped otc decongestants and dizziness seemed to resolve.He does have htn.   Pt states he tested + on 11-21-2019.  He feels nasal congested presently. No sob, no wheezing, no fever, no chills or body aches recently.  Last time had any sob was 2 days ago and when had was minimal.  Pt states cough minimal but some productive.  He states he feels a lot better better today.    Hx of htn. bp high today. Pt will check again 3 consecutive readings.  Pt does not have o2 sat monitor.Pt wife does have APP on her phone and was 94%.  Pt has been selected/qualified for monoclonal antibodies. He has appointment at 10:30.    Observations/Objective:  General-no acute distress, pleasant, oriented. Lungs- on inspection lungs appear unlabored. Neck- no tracheal deviation or jvd on inspection. Neuro- gross motor function appears intact.   Assessment and Plan: For recent covid infection, I am making azithoromycin antibiotic available to start in light of your reported productive cough. For cough, I rx'd benzonatate. For nasal congestion I rx'd flonase.   htn- check bp. Check bp daily as explained. If bp above 140/90 then will add low dose norvasc.Pt aware to not use otc  decongestants.  Monoclonal antibody tx scheduled for today.   Check o2 sats daily and educated on levels.  Follow up thursday. Return to work date pending    Follow Up Instructions:    I discussed the assessment and treatment plan with the patient. The patient was provided an opportunity to ask questions and all were answered. The patient agreed with the plan and demonstrated an understanding of the instructions.   The patient was advised to call back or seek an in-person evaluation if the symptoms worsen or if the condition fails to improve as anticipated.  I provided 20 minutes of non-face-to-face time during this encounter.   Mackie Pai, PA-C   Review of Systems  Constitutional: Negative for chills, fatigue and fever.  HENT: Positive for congestion.   Respiratory: Negative for cough, chest tightness, shortness of breath and wheezing.        See hpi. Some sob 2 days ago.  Cardiovascular: Negative for chest pain and palpitations.  Gastrointestinal: Negative for abdominal distention, abdominal pain, constipation, diarrhea, nausea and vomiting.  Musculoskeletal: Negative for back pain and myalgias.  Skin: Negative for rash.  Neurological: Negative for dizziness, syncope, weakness and headaches.  Hematological: Negative for adenopathy. Does not bruise/bleed easily.  Psychiatric/Behavioral: Negative for behavioral problems and confusion.       Objective:   Physical Exam        Assessment & Plan:

## 2019-11-25 NOTE — Discharge Instructions (Signed)
10 Things You Can Do to Manage Your COVID-19 Symptoms at Home If you have possible or confirmed COVID-19: 1. Stay home from work and school. And stay away from other public places. If you must go out, avoid using any kind of public transportation, ridesharing, or taxis. 2. Monitor your symptoms carefully. If your symptoms get worse, call your healthcare provider immediately. 3. Get rest and stay hydrated. 4. If you have a medical appointment, call the healthcare provider ahead of time and tell them that you have or may have COVID-19. 5. For medical emergencies, call 911 and notify the dispatch personnel that you have or may have COVID-19. 6. Cover your cough and sneezes with a tissue or use the inside of your elbow. 7. Wash your hands often with soap and water for at least 20 seconds or clean your hands with an alcohol-based hand sanitizer that contains at least 60% alcohol. 8. As much as possible, stay in a specific room and away from other people in your home. Also, you should use a separate bathroom, if available. If you need to be around other people in or outside of the home, wear a mask. 9. Avoid sharing personal items with other people in your household, like dishes, towels, and bedding. 10. Clean all surfaces that are touched often, like counters, tabletops, and doorknobs. Use household cleaning sprays or wipes according to the label instructions. cdc.gov/coronavirus 04/27/2019 This information is not intended to replace advice given to you by your health care provider. Make sure you discuss any questions you have with your health care provider. Document Revised: 09/29/2019 Document Reviewed: 09/29/2019 Elsevier Patient Education  2020 Elsevier Inc.  What types of side effects do monoclonal antibody drugs cause?  Common side effects  In general, the more common side effects caused by monoclonal antibody drugs include: . Allergic reactions, such as hives or itching . Flu-like signs and  symptoms, including chills, fatigue, fever, and muscle aches and pains . Nausea, vomiting . Diarrhea . Skin rashes . Low blood pressure   The CDC is recommending patients who receive monoclonal antibody treatments wait at least 90 days before being vaccinated.  Currently, there are no data on the safety and efficacy of mRNA COVID-19 vaccines in persons who received monoclonal antibodies or convalescent plasma as part of COVID-19 treatment. Based on the estimated half-life of such therapies as well as evidence suggesting that reinfection is uncommon in the 90 days after initial infection, vaccination should be deferred for at least 90 days, as a precautionary measure until additional information becomes available, to avoid interference of the antibody treatment with vaccine-induced immune responses.  What types of side effects do monoclonal antibody drugs cause?  Common side effects  In general, the more common side effects caused by monoclonal antibody drugs include: . Allergic reactions, such as hives or itching . Flu-like signs and symptoms, including chills, fatigue, fever, and muscle aches and pains . Nausea, vomiting . Diarrhea . Skin rashes . Low blood pressure   The CDC is recommending patients who receive monoclonal antibody treatments wait at least 90 days before being vaccinated.  Currently, there are no data on the safety and efficacy of mRNA COVID-19 vaccines in persons who received monoclonal antibodies or convalescent plasma as part of COVID-19 treatment. Based on the estimated half-life of such therapies as well as evidence suggesting that reinfection is uncommon in the 90 days after initial infection, vaccination should be deferred for at least 90 days, as a precautionary measure until   additional information becomes available, to avoid interference of the antibody treatment with vaccine-induced immune responses. 

## 2019-11-25 NOTE — Patient Instructions (Addendum)
For recent covid infection, I am making azithoromycin antibiotic available to start in light of your reported productive cough. For cough, I rx'd benzonatate. For nasal congestion I rx'd flonase.   htn- check bp. Check bp daily as explained. If bp above 140/90 then will add low dose norvasc.Pt aware to not use otc decongestants.  Monoclonal antibody tx scheduled for today.   Check o2 sats daily and educated on levels.  Follow up thursday. Return to work date pending.

## 2019-12-01 ENCOUNTER — Other Ambulatory Visit: Payer: Self-pay

## 2019-12-01 ENCOUNTER — Ambulatory Visit (INDEPENDENT_AMBULATORY_CARE_PROVIDER_SITE_OTHER): Payer: BC Managed Care – PPO | Admitting: Medical

## 2019-12-01 ENCOUNTER — Encounter: Payer: Self-pay | Admitting: Medical

## 2019-12-01 VITALS — BP 160/98

## 2019-12-01 DIAGNOSIS — R42 Dizziness and giddiness: Secondary | ICD-10-CM

## 2019-12-01 DIAGNOSIS — R739 Hyperglycemia, unspecified: Secondary | ICD-10-CM

## 2019-12-01 NOTE — Progress Notes (Addendum)
   Subjective:    Patient ID: Theodore Maldonado, male    DOB: [birth year], 49 y.o.   MRN: [id number]  HPI  Virtual Visit via Video Note  I connected with Theodore Maldonado on 12/01/19 at 11:20 AM EST by a video enabled telemedicine application and verified that I am speaking with the correct person using two identifiers.  Location: Patient: home Provider: office   I discussed the limitations of evaluation and management by telemedicine and the availability of in person appointments. The patient expressed understanding and agreed to proceed.  History of Present Illness:  Pt states he feels dizzy. It occurs on changing positions. He is feeling light headed with episodes of vertigo. He had covid recently. Mild nasal congestion and random dry cough. No fever, no chills, no sob or bodyaches.   Pt states his energy is coming back.  He was supposed to go back to work tomorrow.  Dizziness started about 2 days ago. It will come sporadically. He states feels fine during the day then will have brief episode. No gross motor or sensory function deficits on review.     Observations/Objective: General-no acute distress, pleasant, oriented. Lungs- on inspection lungs appear unlabored. Neck- no tracheal deviation or jvd on inspection. Neuro- gross motor function appears intact.(exam 12/05/2019 in office)  Assessment and Plan: You have improvement of all your symptoms except dizziness recently. I want you to keep well hydrated, take meclizine as explained and check bp/pulse. Please update me on bp and pulse as abnormal value could be factor in dizziness.   Work excuse sent to my chart.  Cbc and cmp tomorrow. Will talk with pt and do neurologic exam on Monday.  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   Follow up Monday or as needed.   11/29/2019. bp is high. Normal neuro exam. Cbc and cmp pending. Will write return  to work note today. But no operating heavy machinery until dizziness resolved and get to recheck bp. Will add norvasc 5 mg if bp rechecks at home above 140/90. Please get bp cuff to check.  Theodore Pai, PA-C  Follow Up Instructions:    I discussed the assessment and treatment plan with the patient. The patient was provided an opportunity to ask questions and all were answered. The patient agreed with the plan and demonstrated an understanding of the instructions.   The patient was advised to call back or seek an in-person evaluation if the symptoms worsen or if the condition fails to improve as anticipated.  I provided 20+  minutes of non-face-to-face time during this encounter.   Theodore Pai, PA-C    Review of Systems  Constitutional: Negative for chills, fatigue and fever.  HENT: Negative for congestion and ear pain.   Respiratory: Negative for cough, chest tightness, shortness of breath and wheezing.   Cardiovascular: Negative for chest pain and palpitations.  Gastrointestinal: Negative for abdominal pain, diarrhea, nausea and vomiting.  Musculoskeletal: Negative for back pain and neck pain.  Skin: Negative for rash.  Neurological: Positive for dizziness. Negative for seizures, facial asymmetry, speech difficulty, weakness and headaches.  Hematological: Negative for adenopathy. Does not bruise/bleed easily.  Psychiatric/Behavioral: Negative for decreased concentration and suicidal ideas. The patient is not nervous/anxious.        Objective:   Physical Exam        Assessment & Plan:

## 2019-12-01 NOTE — Patient Instructions (Addendum)
You have improvement of all your symptoms except dizziness recently. I want you to keep well hydrated, take meclizine as explained and check bp/pulse. Please update me on bp and pulse as abnormal value could be factor in dizziness.   Work excuse sent to my chart.  Cbc and cmp tomorrow. Will talk with pt and do neurologic exam on Monday.  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   Follow up Monday or as needed.

## 2019-12-05 ENCOUNTER — Other Ambulatory Visit: Payer: Self-pay

## 2019-12-05 ENCOUNTER — Other Ambulatory Visit (INDEPENDENT_AMBULATORY_CARE_PROVIDER_SITE_OTHER): Payer: BC Managed Care – PPO

## 2019-12-05 DIAGNOSIS — R42 Dizziness and giddiness: Secondary | ICD-10-CM

## 2019-12-05 DIAGNOSIS — R739 Hyperglycemia, unspecified: Secondary | ICD-10-CM

## 2019-12-05 LAB — CBC WITH DIFFERENTIAL/PLATELET
Basophils Absolute: 0 10*3/uL (ref 0.0–0.1)
Basophils Relative: 0.3 % (ref 0.0–3.0)
Eosinophils Absolute: 0.2 10*3/uL (ref 0.0–0.7)
Eosinophils Relative: 3.9 % (ref 0.0–5.0)
HCT: 44 % (ref 39.0–52.0)
Hemoglobin: 14.8 g/dL (ref 13.0–17.0)
Lymphocytes Relative: 39.7 % (ref 12.0–46.0)
Lymphs Abs: 2.3 10*3/uL (ref 0.7–4.0)
MCHC: 33.6 g/dL (ref 30.0–36.0)
MCV: 84.9 fl (ref 78.0–100.0)
Monocytes Absolute: 0.6 10*3/uL (ref 0.1–1.0)
Monocytes Relative: 10 % (ref 3.0–12.0)
Neutro Abs: 2.6 10*3/uL (ref 1.4–7.7)
Neutrophils Relative %: 46.1 % (ref 43.0–77.0)
Platelets: 270 10*3/uL (ref 150.0–400.0)
RBC: 5.18 Mil/uL (ref 4.22–5.81)
RDW: 13.7 % (ref 11.5–15.5)
WBC: 5.7 10*3/uL (ref 4.0–10.5)

## 2019-12-05 LAB — COMPREHENSIVE METABOLIC PANEL
ALT: 37 U/L (ref 0–53)
AST: 21 U/L (ref 0–37)
Albumin: 4 g/dL (ref 3.5–5.2)
Alkaline Phosphatase: 62 U/L (ref 39–117)
BUN: 15 mg/dL (ref 6–23)
CO2: 28 mEq/L (ref 19–32)
Calcium: 9.3 mg/dL (ref 8.4–10.5)
Chloride: 107 mEq/L (ref 96–112)
Creatinine, Ser: 1.29 mg/dL (ref 0.40–1.50)
GFR: 72.12 mL/min (ref >60.00)
Glucose, Bld: 83 mg/dL (ref 70–99)
Potassium: 4.3 mEq/L (ref 3.5–5.1)
Sodium: 142 mEq/L (ref 135–145)
Total Bilirubin: 0.6 mg/dL (ref 0.2–1.2)
Total Protein: 7.4 g/dL (ref 6.0–8.3)

## 2019-12-05 LAB — HEMOGLOBIN A1C: Hgb A1c MFr Bld: 5.8 % (ref 4.6–6.5)

## 2019-12-05 MED ORDER — AMLODIPINE BESYLATE 5 MG PO TABS: 5.0000 mg | ORAL_TABLET | Freq: Every day | ORAL | 0 refills | Status: DC

## 2019-12-05 NOTE — Addendum Note (Signed)
Addended by: Anabel Halon on: 12/05/2019 02:36 PM   Modules accepted: Orders

## 2019-12-12 ENCOUNTER — Other Ambulatory Visit: Payer: Self-pay

## 2019-12-12 ENCOUNTER — Ambulatory Visit (INDEPENDENT_AMBULATORY_CARE_PROVIDER_SITE_OTHER): Payer: BC Managed Care – PPO | Admitting: Medical

## 2019-12-12 ENCOUNTER — Encounter: Payer: Self-pay | Admitting: Medical

## 2019-12-12 VITALS — BP 138/97 | HR 89

## 2019-12-12 DIAGNOSIS — I10 Essential (primary) hypertension: Secondary | ICD-10-CM | POA: Diagnosis not present

## 2019-12-12 NOTE — Progress Notes (Signed)
   Subjective:    Patient ID: Theodore Maldonado, male    DOB: 08/05/1971, 49 y.o.   MRN: [id number]  HPI  Virtual Visit via Video Note  I connected with Theodore Maldonado on 12/12/19 at 11:00 AM EST by a video enabled telemedicine application and verified that I am speaking with the correct person using two identifiers.  Location: Patient: home Provider: office   I discussed the limitations of evaluation and management by telemedicine and the availability of in person appointments. The patient expressed understanding and agreed to proceed.  History of Present Illness:   Pt states his dizziness went away as I added amlodipine to his bp regimen last week. He continue the losartan 50 mg daily dose. He state 2 days later the dizziness resolved by Wednesday. His bp was 138/97.  Pt bp was higher when he came in for nurse visit. Was about 160/101 that day.   Now feels fine. He states he went back to work already.   He has been back to work past Tuesday.   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   Observations/Objective: General-no acute distress, pleasant, oriented. Lungs- on inspection lungs appear unlabored. Neck- no tracheal deviation or jvd on inspection. Neuro- gross motor function appears intact.  Assessment and Plan: Your dizziness is now resolved with much better bp level. But diastolic is not ideal. Continue current regimen and check bp daily.  Update me on Monday what bp readings are. If you bp does not drop to 130/80 range then will increase amlodipine to 10 mg daily.  Follow Up Instructions:    I discussed the assessment and treatment plan with the patient. The patient was provided an opportunity to ask questions and all were answered. The patient agreed with the plan and demonstrated an understanding of the instructions.   The patient was advised to call back or seek an in-person evaluation if the symptoms worsen or if the  condition fails to improve as anticipated.  I provided 20 minutes of non-face-to-face time during this encounter.   Mackie Pai, PA-C   Review of Systems     Objective:   Physical Exam        Assessment & Plan:

## 2019-12-12 NOTE — Patient Instructions (Addendum)
Your dizziness is now resolved with  much better bp level. But diastolic is not ideal. Continue current regimen and check bp daily.  Update me on Monday/in one week what bp readings are. If you bp does not drop to 130/80 range then will increase amlodipine to 10 mg daily.

## 2019-12-17 ENCOUNTER — Other Ambulatory Visit: Payer: Self-pay | Admitting: Medical

## 2019-12-27 ENCOUNTER — Other Ambulatory Visit: Payer: Self-pay | Admitting: Medical

## 2020-01-23 ENCOUNTER — Other Ambulatory Visit: Payer: Self-pay | Admitting: Medical

## 2020-02-28 ENCOUNTER — Other Ambulatory Visit: Payer: Self-pay | Admitting: Medical

## 2020-04-03 ENCOUNTER — Other Ambulatory Visit: Payer: Self-pay | Admitting: Medical

## 2020-05-09 ENCOUNTER — Other Ambulatory Visit: Payer: Self-pay | Admitting: Medical

## 2020-05-21 ENCOUNTER — Ambulatory Visit: Payer: BC Managed Care – PPO | Attending: Internal Medicine

## 2020-05-21 ENCOUNTER — Other Ambulatory Visit: Payer: Self-pay

## 2020-05-21 DIAGNOSIS — Z20822 Contact with and (suspected) exposure to covid-19: Secondary | ICD-10-CM | POA: Diagnosis not present

## 2020-05-22 LAB — SARS-COV-2, NAA 2 DAY TAT

## 2020-05-22 LAB — NOVEL CORONAVIRUS, NAA: SARS-CoV-2, NAA: NOT DETECTED

## 2020-05-23 ENCOUNTER — Other Ambulatory Visit: Payer: Self-pay | Admitting: Medical

## 2020-05-24 ENCOUNTER — Telehealth: Payer: Self-pay | Admitting: Medical

## 2020-05-24 MED ORDER — LOSARTAN POTASSIUM 50 MG PO TABS: ORAL_TABLET | ORAL | 3 refills | Status: DC

## 2020-05-24 NOTE — Telephone Encounter (Signed)
Rx sent 

## 2020-05-24 NOTE — Telephone Encounter (Signed)
Medication: losartan (COZAAR) 50 MG tablet [[id number]]  Has the patient contacted their pharmacy? No. (If no, request that the patient contact the pharmacy for the refill.) (If yes, when and what did the pharmacy advise?)  Preferred Pharmacy (with phone number or street name): [id number] [address]  [place], [place] [address]  Phone:  [phone] Fax:  [phone]  DEA #:  [id number]   Agent: Please be advised that RX refills may take up to 3 business days. We ask that you follow-up with your pharmacy.

## 2020-06-14 ENCOUNTER — Other Ambulatory Visit: Payer: Self-pay | Admitting: Medical

## 2020-07-16 ENCOUNTER — Other Ambulatory Visit: Payer: Self-pay | Admitting: Medical

## 2020-07-20 ENCOUNTER — Other Ambulatory Visit: Payer: BC Managed Care – PPO

## 2020-07-20 DIAGNOSIS — Z20822 Contact with and (suspected) exposure to covid-19: Secondary | ICD-10-CM | POA: Diagnosis not present

## 2020-07-22 LAB — SPECIMEN STATUS REPORT

## 2020-07-22 LAB — SARS-COV-2, NAA 2 DAY TAT

## 2020-07-22 LAB — NOVEL CORONAVIRUS, NAA: SARS-CoV-2, NAA: NOT DETECTED

## 2020-08-17 ENCOUNTER — Other Ambulatory Visit: Payer: Self-pay | Admitting: Medical

## 2020-09-05 ENCOUNTER — Other Ambulatory Visit: Payer: Self-pay | Admitting: Medical

## 2020-09-19 ENCOUNTER — Other Ambulatory Visit: Payer: Self-pay | Admitting: Medical

## 2020-10-09 ENCOUNTER — Other Ambulatory Visit: Payer: Self-pay | Admitting: Medical

## 2020-10-26 ENCOUNTER — Other Ambulatory Visit: Payer: Self-pay | Admitting: Medical

## 2020-11-11 ENCOUNTER — Other Ambulatory Visit: Payer: Self-pay | Admitting: Medical

## 2020-11-30 ENCOUNTER — Telehealth: Payer: Self-pay | Admitting: Medical

## 2020-11-30 ENCOUNTER — Other Ambulatory Visit: Payer: Self-pay | Admitting: Medical

## 2020-11-30 MED ORDER — AMLODIPINE BESYLATE 5 MG PO TABS: 5.0000 mg | ORAL_TABLET | Freq: Every day | ORAL | 0 refills | Status: DC

## 2020-11-30 NOTE — Telephone Encounter (Signed)
Patient has an appt for 12/06/20  Need some medication   Medication: amLODipine (NORVASC) 5 MG tablet [[id number]]       Has the patient contacted their pharmacy?  (If no, request that the patient contact the pharmacy for the refill.) (If yes, when and what did the pharmacy advise?)     Preferred Pharmacy (with phone number or street name):  [id number] [place] [place]  [place], [place] [address]  Phone:  [phone] Fax:  [phone]     Agent: Please be advised that RX refills may take up to 3 business days. We ask that you follow-up with your pharmacy.

## 2020-11-30 NOTE — Telephone Encounter (Signed)
Rx sent 

## 2020-12-06 ENCOUNTER — Ambulatory Visit: Payer: BC Managed Care – PPO | Admitting: Medical

## 2020-12-20 ENCOUNTER — Telehealth: Payer: Self-pay | Admitting: Medical

## 2020-12-20 ENCOUNTER — Ambulatory Visit: Payer: BC Managed Care – PPO | Admitting: Medical

## 2020-12-20 ENCOUNTER — Telehealth: Payer: Self-pay

## 2020-12-20 MED ORDER — AMLODIPINE BESYLATE 5 MG PO TABS: 5.0000 mg | ORAL_TABLET | Freq: Every day | ORAL | 0 refills | Status: DC

## 2020-12-20 NOTE — Telephone Encounter (Signed)
Opened to review 

## 2020-12-20 NOTE — Telephone Encounter (Signed)
[facility]

## 2020-12-20 NOTE — Telephone Encounter (Signed)
Patient presented to office this morning for an appointment that was canceled, on accident, not by the patient.  I spoke with the patient, who was very understanding about the issue, and got him rescheduled to Monday, 12/24/20 at 1 pm with PCP.  Patient did request a temporary refill on amlodipine 5 mg (which I did call per PCP and clarify to be the current dosage patient is on).  [facility]  Patient stated he is currently fine with the losartan prescription and does not need a temporary refill on it.

## 2020-12-24 ENCOUNTER — Ambulatory Visit: Payer: BC Managed Care – PPO | Admitting: Medical

## 2020-12-24 ENCOUNTER — Other Ambulatory Visit: Payer: Self-pay

## 2020-12-24 ENCOUNTER — Other Ambulatory Visit (HOSPITAL_COMMUNITY): Payer: Self-pay | Admitting: Medical

## 2020-12-24 VITALS — BP 126/83 | HR 74 | Resp 20 | Ht 66.0 in | Wt 228.2 lb

## 2020-12-24 DIAGNOSIS — I10 Essential (primary) hypertension: Secondary | ICD-10-CM | POA: Diagnosis not present

## 2020-12-24 DIAGNOSIS — R748 Abnormal levels of other serum enzymes: Secondary | ICD-10-CM | POA: Diagnosis not present

## 2020-12-24 DIAGNOSIS — Z125 Encounter for screening for malignant neoplasm of prostate: Secondary | ICD-10-CM | POA: Diagnosis not present

## 2020-12-24 DIAGNOSIS — R739 Hyperglycemia, unspecified: Secondary | ICD-10-CM

## 2020-12-24 MED ORDER — LOSARTAN POTASSIUM 100 MG PO TABS: 100.0000 mg | ORAL_TABLET | Freq: Every day | ORAL | 3 refills | Status: DC

## 2020-12-24 MED ORDER — AMLODIPINE BESYLATE 5 MG PO TABS: 5.0000 mg | ORAL_TABLET | Freq: Every day | ORAL | 3 refills | Status: DC

## 2020-12-24 MED FILL — LOSARTAN POTASSIUM 100 MG T: 100 | 30 days supply | Qty: 30 | Fill #0

## 2020-12-24 NOTE — Patient Instructions (Addendum)
Hx of htn. Bp well controlled on current regmimen amlodipine 5 mg daily and losartan 100 mg daily.  For elevated cholesterol will get cmp and lipid panel today. Low cholesterol diet recommended.  For elevated liver enzymes will follow enzymes on labs today.  Will get screening psa today.  Also went ahead and placed referral to gi for screening colonoscopy.  Follow up 3-6  Months or as needed

## 2020-12-24 NOTE — Progress Notes (Signed)
Subjective:    Patient ID: SHAHEEM PICHON, male    DOB: December 03, 1970, 50 y.o.   MRN: [id number]  HPI Pt in for follow up.  Pt bp is well controlled today. Pt is losartan 100 mg daily and amlodipine 5 mg daily.  Pt sugar is elevated in the past.  Hx elevated liver enzymes.   Mild ldl cholesterol elevation in the past.     Review of Systems  Constitutional: Negative for appetite change, diaphoresis, fatigue and unexpected weight change.  HENT: Negative for congestion.   Respiratory: Negative for cough, chest tightness, shortness of breath and wheezing.   Cardiovascular: Negative for chest pain and palpitations.  Gastrointestinal: Negative for abdominal distention, abdominal pain, anal bleeding, diarrhea and rectal pain.  Genitourinary: Negative for dysuria, flank pain and frequency.  Musculoskeletal: Negative for back pain and neck pain.  Skin: Negative for rash.  Neurological: Negative for dizziness, speech difficulty, weakness, numbness and headaches.  Hematological: Negative for adenopathy. Does not bruise/bleed easily.  Psychiatric/Behavioral: Negative for behavioral problems, confusion and decreased concentration. The patient is not nervous/anxious.      Past Medical History:  Diagnosis Date  . Elbow effusion    R  . Frequent headaches   . Gout    toe  . Hypertension   . Migraines      Social History   Socioeconomic History  . Marital status: Single    Spouse name: Not on file  . Number of children: 2  . Years of education: Not on file  . Highest education level: Not on file  Occupational History    Employer: [employer]    Comment: Banker  Tobacco Use  . Smoking status: Never Smoker  . Smokeless tobacco: Never Used  Substance and Sexual Activity  . Alcohol use: Yes    Comment: occasional  . Drug use: No  . Sexual activity: Never  Other Topics Concern  . Not on file  Social History Narrative   Mr. Ditmer lives with his sister. He has  two daughters. Works [employer].   Social Determinants of Health   Financial Resource Strain: Not on file  Food Insecurity: Not on file  Transportation Needs: Not on file  Physical Activity: Not on file  Stress: Not on file  Social Connections: Not on file  Intimate Partner Violence: Not on file    Past Surgical History:  Procedure Laterality Date  . TESTICLE TORSION REDUCTION     child    Family History  Problem Relation Age of Onset  . Hypertension Mother   . Hypertension Sister     No Known Allergies  Current Outpatient Medications on File Prior to Visit  Medication Sig Dispense Refill  . amLODipine (NORVASC) 5 MG tablet Take 1 tablet (5 mg total) by mouth daily. 7 tablet 0  . losartan (COZAAR) 50 MG tablet 2 tab po q day 180 tablet 3  . meclizine (ANTIVERT) 12.5 MG tablet Take 12.5 mg by mouth 3 (three) times daily as needed for dizziness.     No current facility-administered medications on file prior to visit.    BP 126/83   Pulse 74   Resp 20   Ht _0  (1.676 m)   Wt 228 lb 3.2 oz (103.5 kg)   SpO2 100%   BMI 36.83 kg/m       Objective:   Physical Exam   General Mental Status- Alert. General Appearance- Not in acute distress.   Skin General: Color- Normal Color.  Moisture- Normal Moisture.  Neck Carotid Arteries- Normal color. Moisture- Normal Moisture. No carotid bruits. No JVD.  Chest and Lung Exam Auscultation: Breath Sounds:-Normal.  Cardiovascular Auscultation:Rythm- Regular. Murmurs & Other Heart Sounds:Auscultation of the heart reveals- No Murmurs.  Abdomen Inspection:-Inspeection Normal. Palpation/Percussion:Note:No mass. Palpation and Percussion of the abdomen reveal- Non Tender, Non Distended + BS, no rebound or guarding.    Neurologic Cranial Nerve exam:- CN III-XII intact(No nystagmus), symmetric smile. Strength:- 5/5 equal and symmetric strength both upper and lower extremities.     Assessment & Plan:  Hx of htn. Bp well  controlled on current regmimen amlodipine 5 mg daily and losartan 100 mg daily.  For elevated cholesterol will get cmp and lipid panel today. Low cholesterol diet recommended.  For elevated liver enzymes will follow enzymes on labs today.  Will get screening psa today.  Also went ahead and placed referral to gi for screening colonoscopy.  Follow up 3-6  Months or as needed  Mackie Pai, PA-C

## 2020-12-25 LAB — LIPID PANEL
Cholesterol: 210 mg/dL — ABNORMAL HIGH (ref 0–200)
HDL: 57.6 mg/dL (ref >39.00)
LDL Cholesterol: 131 mg/dL — ABNORMAL HIGH (ref 0–99)
NonHDL: 151.9
Total CHOL/HDL Ratio: 4
Triglycerides: 107 mg/dL (ref 0.0–149.0)
VLDL: 21.4 mg/dL (ref 0.0–40.0)

## 2020-12-25 LAB — COMPREHENSIVE METABOLIC PANEL
ALT: 28 U/L (ref 0–53)
AST: 18 U/L (ref 0–37)
Albumin: 4.2 g/dL (ref 3.5–5.2)
Alkaline Phosphatase: 55 U/L (ref 39–117)
BUN: 17 mg/dL (ref 6–23)
CO2: 24 mEq/L (ref 19–32)
Calcium: 9.5 mg/dL (ref 8.4–10.5)
Chloride: 104 mEq/L (ref 96–112)
Creatinine, Ser: 1.09 mg/dL (ref 0.40–1.50)
GFR: 80.29 mL/min (ref >60.00)
Glucose, Bld: 90 mg/dL (ref 70–99)
Potassium: 4.5 mEq/L (ref 3.5–5.1)
Sodium: 135 mEq/L (ref 135–145)
Total Bilirubin: 0.5 mg/dL (ref 0.2–1.2)
Total Protein: 7.8 g/dL (ref 6.0–8.3)

## 2020-12-25 LAB — PSA: PSA: 1.42 ng/mL (ref 0.10–4.00)

## 2020-12-25 LAB — HEMOGLOBIN A1C: Hgb A1c MFr Bld: 5.5 % (ref 4.6–6.5)

## 2020-12-25 MED FILL — AMLODIPINE BESYLATE 5 MG TA: 5 | 30 days supply | Qty: 30 | Fill #0

## 2021-01-30 ENCOUNTER — Other Ambulatory Visit: Payer: Self-pay | Admitting: Medical

## 2021-01-30 ENCOUNTER — Telehealth: Payer: Self-pay | Admitting: Medical

## 2021-01-30 MED ORDER — AMLODIPINE BESYLATE 5 MG PO TABS: 5.0000 mg | ORAL_TABLET | Freq: Every day | ORAL | 3 refills | Status: DC

## 2021-01-30 NOTE — Telephone Encounter (Signed)
New pharmacy Medication: amLODipine (NORVASC) 5 MG tablet [[id number]]       Has the patient contacted their pharmacy?  (If no, request that the patient contact the pharmacy for the refill.) (If yes, when and what did the pharmacy advise?)     Preferred Pharmacy (with phone number or street name):  [id number] [place], [place] Phone:  [phone]  Fax:  [phone]          Agent: Please be advised that RX refills may take up to 3 business days. We ask that you follow-up with your pharmacy.

## 2021-01-30 NOTE — Telephone Encounter (Signed)
Rx sent 

## 2021-03-29 ENCOUNTER — Other Ambulatory Visit: Payer: Self-pay | Admitting: Gastroenterology

## 2021-03-29 ENCOUNTER — Ambulatory Visit (AMBULATORY_SURGERY_CENTER): Payer: BC Managed Care – PPO

## 2021-03-29 ENCOUNTER — Other Ambulatory Visit: Payer: Self-pay

## 2021-03-29 VITALS — Ht 66.0 in | Wt 218.0 lb

## 2021-03-29 DIAGNOSIS — Z1211 Encounter for screening for malignant neoplasm of colon: Secondary | ICD-10-CM

## 2021-03-29 MED ORDER — NA SULFATE-K SULFATE-MG SULF 17.5-3.13-1.6 GM/177ML PO SOLN: 1.0000 | Freq: Once | ORAL | 0 refills | Status: AC

## 2021-03-29 NOTE — Progress Notes (Signed)
Pre visit completed via phone call;  Patient verified name, DOB, and address; No egg or soy allergy known to patient  No issues with past sedation with any surgeries or procedures Patient denies ever being told they had issues or difficulty with intubation --- No FH of Malignant Hyperthermia No diet pills per patient No home 02 use per patient  No blood thinners per patient  Pt denies issues with constipation  No A fib or A flutter  EMMI video via MyChart  COVID 19 guidelines implemented in PV today with Pt and RN  Pt is fully vaccinated for Covid x 2 + booster; NO PA's for preps discussed with pt in PV today  Discussed with pt there will be an out-of-pocket cost for prep and that varies from $0 to 70 dollars   Due to the COVID-19 pandemic we are asking patients to follow certain guidelines.  Pt aware of COVID protocols and [facility] guidelines

## 2021-04-11 ENCOUNTER — Telehealth: Payer: Self-pay | Admitting: Gastroenterology

## 2021-04-11 NOTE — Telephone Encounter (Signed)
Inbound call from pt requesting a call back stating he was trying to see if it was an alternative for his prep due to it being expensive. Please advise. Thanks

## 2021-04-11 NOTE — Telephone Encounter (Signed)
Called and spoke with patient - patient reports the pharmacy never told him the prep was ready nor that it was so expensive; patient has requested a sample be provided since his procedure is tomorrow; patient advised a sample and revised instructions would be placed on the 3 rd floor for pick up at his request; patient verbalized understanding of need to pick up prep and instructions prior to EOB;

## 2021-04-12 ENCOUNTER — Ambulatory Visit (AMBULATORY_SURGERY_CENTER): Payer: BC Managed Care – PPO | Admitting: Gastroenterology

## 2021-04-12 ENCOUNTER — Encounter: Payer: Self-pay | Admitting: Gastroenterology

## 2021-04-12 ENCOUNTER — Other Ambulatory Visit: Payer: Self-pay

## 2021-04-12 VITALS — BP 114/72 | HR 66 | Temp 97.8°F | Resp 19 | Ht 66.0 in | Wt 218.0 lb

## 2021-04-12 DIAGNOSIS — K635 Polyp of colon: Secondary | ICD-10-CM

## 2021-04-12 DIAGNOSIS — Z1211 Encounter for screening for malignant neoplasm of colon: Secondary | ICD-10-CM | POA: Diagnosis not present

## 2021-04-12 DIAGNOSIS — D124 Benign neoplasm of descending colon: Secondary | ICD-10-CM

## 2021-04-12 MED ORDER — SODIUM CHLORIDE 0.9 % IV SOLN: 500.0000 mL | Freq: Once | INTRAVENOUS | Status: AC

## 2021-04-12 NOTE — Op Note (Signed)
[facility] Patient Name: Theodore Maldonado Procedure Date: 04/12/2021 10:49 AM MRN: [id number] Endoscopist: Mallie Mussel L. Loletha Carrow , MD Age: 50 Referring MD:  Date of Birth: January 11, 1971 Gender: Male Account #: [id number] Procedure:                Colonoscopy Indications:              Screening for colorectal malignant neoplasm, This                            is the patient's first colonoscopy Medicines:                Monitored Anesthesia Care Procedure:                Pre-Anesthesia Assessment:                           - Prior to the procedure, a History and Physical                            was performed, and patient medications and                            allergies were reviewed. The patient's tolerance of                            previous anesthesia was also reviewed. The risks                            and benefits of the procedure and the sedation                            options and risks were discussed with the patient.                            All questions were answered, and informed consent                            was obtained. Prior Anticoagulants: The patient has                            taken no previous anticoagulant or antiplatelet                            agents. ASA Grade Assessment: II - A patient with                            mild systemic disease. After reviewing the risks                            and benefits, the patient was deemed in                            satisfactory condition to undergo the procedure.  After obtaining informed consent, the colonoscope                            was passed under direct vision. Throughout the                            procedure, the patient's blood pressure, pulse, and                            oxygen saturations were monitored continuously. The                            Olympus CF-HQ190L [id number]) Colonoscope was                            introduced through the anus  and advanced to the the                            cecum, identified by appendiceal orifice and                            ileocecal valve. The colonoscopy was performed                            without difficulty. The patient tolerated the                            procedure well. The quality of the bowel                            preparation was good. The ileocecal valve,                            appendiceal orifice, and rectum were photographed. Scope In: 10:59:55 AM Scope Out: 11:09:16 AM Scope Withdrawal Time: 0 hours 7 minutes 31 seconds  Total Procedure Duration: 0 hours 9 minutes 21 seconds  Findings:                 The perianal and digital rectal examinations were                            normal.                           A 4 mm polyp was found in the descending colon. The                            polyp was flat. The polyp was removed with a cold                            snare. Resection and retrieval were complete.                           The exam was otherwise without abnormality on  direct and retroflexion views. Complications:            No immediate complications. Estimated Blood Loss:     Estimated blood loss was minimal. Impression:               - One 4 mm polyp in the descending colon, removed                            with a cold snare. Resected and retrieved.                           - The examination was otherwise normal on direct                            and retroflexion views. Recommendation:           - Patient has a contact number available for                            emergencies. The signs and symptoms of potential                            delayed complications were discussed with the                            patient. Return to normal activities tomorrow.                            Written discharge instructions were provided to the                            patient.                           - Resume  previous diet.                           - Continue present medications.                           - Await pathology results.                           - Repeat colonoscopy is recommended for                            surveillance. The colonoscopy date will be                            determined after pathology results from today's                            exam become available for review. Kyliegh Jester L. Loletha Carrow, MD 04/12/2021 11:11:36 AM This report has been signed electronically.

## 2021-04-12 NOTE — Patient Instructions (Signed)
Resume previous diet and medications. Awaiting pathology results. Repeat colonoscopy based on pathology results for surveillance.  YOU HAD AN ENDOSCOPIC PROCEDURE TODAY AT [facility]:   Refer to the procedure report that was given to you for any specific questions about what was found during the examination.  If the procedure report does not answer your questions, please call your gastroenterologist to clarify.  If you requested that your care partner not be given the details of your procedure findings, then the procedure report has been included in a sealed envelope for you to review at your convenience later.  YOU SHOULD EXPECT: Some feelings of bloating in the abdomen. Passage of more gas than usual.  Walking can help get rid of the air that was put into your GI tract during the procedure and reduce the bloating. If you had a lower endoscopy (such as a colonoscopy or flexible sigmoidoscopy) you may notice spotting of blood in your stool or on the toilet paper. If you underwent a bowel prep for your procedure, you may not have a normal bowel movement for a few days.  Please Note:  You might notice some irritation and congestion in your nose or some drainage.  This is from the oxygen used during your procedure.  There is no need for concern and it should clear up in a day or so.  SYMPTOMS TO REPORT IMMEDIATELY:  Following lower endoscopy (colonoscopy or flexible sigmoidoscopy):  Excessive amounts of blood in the stool  Significant tenderness or worsening of abdominal pains  Swelling of the abdomen that is new, acute  Fever of 100F or higher   For urgent or emergent issues, a gastroenterologist can be reached at any hour by calling [phone]. Do not use MyChart messaging for urgent concerns.    DIET:  We do recommend a small meal at first, but then you may proceed to your regular diet.  Drink plenty of fluids but you should avoid alcoholic beverages for 24  hours.  ACTIVITY:  You should plan to take it easy for the rest of today and you should NOT DRIVE or use heavy machinery until tomorrow (because of the sedation medicines used during the test).    FOLLOW UP: Our staff will call the number listed on your records 48-72 hours following your procedure to check on you and address any questions or concerns that you may have regarding the information given to you following your procedure. If we do not reach you, we will leave a message.  We will attempt to reach you two times.  During this call, we will ask if you have developed any symptoms of COVID 19. If you develop any symptoms (ie: fever, flu-like symptoms, shortness of breath, cough etc.) before then, please call [phone].  If you test positive for Covid 19 in the 2 weeks post procedure, please call and report this information to [place].    If any biopsies were taken you will be contacted by phone or by letter within the next 1-3 weeks.  Please call us at [phone] if you have not heard about the biopsies in 3 weeks.    SIGNATURES/CONFIDENTIALITY: You and/or your care partner have signed paperwork which will be entered into your electronic medical record.  These signatures attest to the fact that that the information above on your After Visit Summary has been reviewed and is understood.  Full responsibility of the confidentiality of this discharge information lies with you and/or your care-partner.

## 2021-04-12 NOTE — Progress Notes (Signed)
VS by CW  I have reviewed the patient's medical history in detail and updated the computerized patient record.  

## 2021-04-12 NOTE — Progress Notes (Signed)
Called to room to assist during endoscopic procedure.  Patient ID and intended procedure confirmed with present staff. Received instructions for my participation in the procedure from the performing physician.  

## 2021-04-16 ENCOUNTER — Telehealth: Payer: Self-pay

## 2021-04-16 NOTE — Telephone Encounter (Signed)
  Follow up Call-  Call back number 04/12/2021  Post procedure Call Back phone  # [phone]  Permission to leave phone message Yes  Some recent data might be hidden     Patient questions:  Do you have a fever, pain , or abdominal swelling? No. Pain Score  0 *  Have you tolerated food without any problems? Yes.    Have you been able to return to your normal activities? Yes.    Do you have any questions about your discharge instructions: Diet   No. Medications  No. Follow up visit  No.  Do you have questions or concerns about your Care? No.  Actions: * If pain score is 4 or above: No action needed, pain <4.  Have you developed a fever since your procedure? no  2.   Have you had an respiratory symptoms (SOB or cough) since your procedure? no  3.   Have you tested positive for COVID 19 since your procedure no  4.   Have you had any family members/close contacts diagnosed with the COVID 19 since your procedure?  no   If yes to any of these questions please route to Joylene John, RN and Joella Prince, RN

## 2021-04-18 ENCOUNTER — Encounter: Payer: Self-pay | Admitting: Gastroenterology

## 2021-04-26 ENCOUNTER — Ambulatory Visit: Payer: BC Managed Care – PPO | Admitting: Medical

## 2021-04-30 ENCOUNTER — Other Ambulatory Visit: Payer: Self-pay

## 2021-05-01 ENCOUNTER — Encounter: Payer: Self-pay | Admitting: Medical

## 2021-05-01 ENCOUNTER — Ambulatory Visit (INDEPENDENT_AMBULATORY_CARE_PROVIDER_SITE_OTHER): Payer: BC Managed Care – PPO | Admitting: Medical

## 2021-05-01 VITALS — BP 137/80 | HR 75 | Temp 98.1°F | Resp 18 | Ht 66.0 in | Wt 229.0 lb

## 2021-05-01 DIAGNOSIS — R0683 Snoring: Secondary | ICD-10-CM | POA: Diagnosis not present

## 2021-05-01 DIAGNOSIS — I10 Essential (primary) hypertension: Secondary | ICD-10-CM | POA: Diagnosis not present

## 2021-05-01 DIAGNOSIS — R748 Abnormal levels of other serum enzymes: Secondary | ICD-10-CM

## 2021-05-01 DIAGNOSIS — M25461 Effusion, right knee: Secondary | ICD-10-CM

## 2021-05-01 DIAGNOSIS — R739 Hyperglycemia, unspecified: Secondary | ICD-10-CM | POA: Diagnosis not present

## 2021-05-01 LAB — LIPID PANEL
Cholesterol: 213 mg/dL — ABNORMAL HIGH (ref 0–200)
HDL: 56.5 mg/dL (ref >39.00)
LDL Cholesterol: 138 mg/dL — ABNORMAL HIGH (ref 0–99)
NonHDL: 156
Total CHOL/HDL Ratio: 4
Triglycerides: 88 mg/dL (ref 0.0–149.0)
VLDL: 17.6 mg/dL (ref 0.0–40.0)

## 2021-05-01 LAB — COMPREHENSIVE METABOLIC PANEL
ALT: 29 U/L (ref 0–53)
AST: 19 U/L (ref 0–37)
Albumin: 4.3 g/dL (ref 3.5–5.2)
Alkaline Phosphatase: 57 U/L (ref 39–117)
BUN: 16 mg/dL (ref 6–23)
CO2: 27 mEq/L (ref 19–32)
Calcium: 9.6 mg/dL (ref 8.4–10.5)
Chloride: 104 mEq/L (ref 96–112)
Creatinine, Ser: 1.12 mg/dL (ref 0.40–1.50)
GFR: 77.52 mL/min (ref >60.00)
Glucose, Bld: 105 mg/dL — ABNORMAL HIGH (ref 70–99)
Potassium: 4.6 mEq/L (ref 3.5–5.1)
Sodium: 138 mEq/L (ref 135–145)
Total Bilirubin: 0.5 mg/dL (ref 0.2–1.2)
Total Protein: 7.7 g/dL (ref 6.0–8.3)

## 2021-05-01 LAB — HEMOGLOBIN A1C: Hgb A1c MFr Bld: 5.7 % (ref 4.6–6.5)

## 2021-05-01 NOTE — Progress Notes (Signed)
Subjective:    Patient ID: Theodore Maldonado, male    DOB: 1970/12/19, 50 y.o.   MRN: [id number]  HPI  Pt in for follow up on htn. When he checks bp at home his readings less than 140/90. Pt is on amlodipine 5 mg daily and losartan 100 mg daily.   Mild sugar elevation in the past but a1c was not in diabetic range.   Pt has mild high cholesterol elevation in the past.   Hx of knee pain and steroid injections. Pt has swelling of area above rt knee.   Review of Systems  Constitutional:  Negative for chills, fatigue and fever.  Respiratory:  Negative for choking, shortness of breath and wheezing.   Gastrointestinal:  Negative for abdominal distention, abdominal pain and anal bleeding.  Genitourinary:  Negative for dysuria.  Skin:  Negative for rash.  Neurological:  Negative for dizziness, speech difficulty, weakness, numbness and headaches.  Hematological:  Negative for adenopathy. Does not bruise/bleed easily.  Psychiatric/Behavioral:  Negative for agitation, confusion, dysphoric mood and suicidal ideas. The patient is not nervous/anxious.     Past Medical History:  Diagnosis Date   Elbow effusion    R   Frequent headaches    Gout    toe   Hyperlipidemia    on meds   Hypertension    on meds   Migraines      Social History   Socioeconomic History   Marital status: Single    Spouse name: Not on file   Number of children: 2   Years of education: Not on file   Highest education level: Not on file  Occupational History    Employer: [employer]    Comment: Banker  Tobacco Use   Smoking status: Never   Smokeless tobacco: Never  Vaping Use   Vaping Use: Never used  Substance and Sexual Activity   Alcohol use: Yes    Alcohol/week: 6.0 standard drinks    Types: 6 Standard drinks or equivalent per week   Drug use: No   Sexual activity: Never  Other Topics Concern   Not on file  Social History Narrative   Mr. Hershey lives with his sister. He has two  daughters. Works [employer].   Social Determinants of Health   Financial Resource Strain: Not on file  Food Insecurity: Not on file  Transportation Needs: Not on file  Physical Activity: Not on file  Stress: Not on file  Social Connections: Not on file  Intimate Partner Violence: Not on file    Past Surgical History:  Procedure Laterality Date   TESTICLE TORSION REDUCTION     child    Family History  Problem Relation Age of Onset   Hypertension Mother    Hypertension Sister    Colon polyps Neg Hx    Colon cancer Neg Hx    Esophageal cancer Neg Hx    Stomach cancer Neg Hx    Rectal cancer Neg Hx     No Known Allergies  Current Outpatient Medications on File Prior to Visit  Medication Sig Dispense Refill   amLODipine (NORVASC) 5 MG tablet Take 1 tablet (5 mg total) by mouth daily. 90 tablet 3   amLODipine (NORVASC) 5 MG tablet TAKE 1 TABLET (5 MG TOTAL) BY MOUTH DAILY. 90 tablet 3   losartan (COZAAR) 100 MG tablet Take 1 tablet (100 mg total) by mouth daily. (Patient not taking: Reported on 03/29/2021) 90 tablet 3   losartan (COZAAR) 100 MG  tablet TAKE 1 TABLET (100 MG TOTAL) BY MOUTH DAILY. 90 tablet 3   Current Facility-Administered Medications on File Prior to Visit  Medication Dose Route Frequency Provider Last Rate Last Admin   0.9 %  sodium chloride infusion  500 mL Intravenous Once Danis, Estill Cotta III, MD        BP (!) 142/90   Pulse 75   Temp 98.1 F (36.7 C)   Resp 18   Ht _0  (1.676 m)   Wt 229 lb (103.9 kg)   SpO2 97%   BMI 36.96 kg/m       Objective:   Physical Exam  General Mental Status- Alert. General Appearance- Not in acute distress.   Skin General: Color- Normal Color. Moisture- Normal Moisture.  Neck Carotid Arteries- Normal color. Moisture- Normal Moisture. No carotid bruits. No JVD.  Chest and Lung Exam Auscultation: Breath Sounds:-Normal.  Cardiovascular Auscultation:Rythm- Regular. Murmurs & Other Heart Sounds:Auscultation of the  heart reveals- No Murmurs.  Abdomen Inspection:-Inspeection Normal. Palpation/Percussion:Note:No mass. Palpation and Percussion of the abdomen reveal- Non Tender, Non Distended + BS, no rebound or guarding.    Neurologic Cranial Nerve exam:- CN III-XII intact(No nystagmus), symmetric smile. Strength:- 5/5 equal and symmetric strength both upper and lower extremities.       Assessment & Plan:   Your bp is relatively well controlled. Continue both amlodpipine and losartan.  For elevated blood sugar get a1c today.   For high cholesterol  get lipid panel and metabolic panel today. Continue both low sugar and low cholesterol diet.   History of snoring. Sleep study done in 2000. Recommend you call specialist office and try to speak with nurse to understand if they had specific recommendation based on sleep study.   Will refer to sport med based on hx of knee pain with described effusion.  Follow up in 3-6 months(will depend on lab review) or as needed  [employer], PA-C

## 2021-05-01 NOTE — Patient Instructions (Addendum)
Your bp is relatively well controlled. Continue both amlodpipine and losartan.  For elevated blood sugar get a1c today.   For high cholesterol  get lipid panel and metabolic panel today. Continue both low sugar and low cholesterol diet.   History of snoring. Sleep study done in 2000. Recommend you call specialist office and try to speak with nurse to understand if they had specific recommendation based on sleep study.   Will refer to sport med based on hx of knee pain with described effusion.  Follow up in 3-6 months(will depend on lab review) or as needed

## 2021-05-27 ENCOUNTER — Ambulatory Visit: Payer: BC Managed Care – PPO | Admitting: Family Medicine

## 2021-05-27 NOTE — Progress Notes (Deleted)
  [name] - 50 y.o. male MRN [id number]  Date of birth: 05-14-1971  SUBJECTIVE:  Including CC & ROS.  No chief complaint on file.   Theodore Maldonado is a 50 y.o. male that is  ***.  ***   Review of Systems See HPI   HISTORY: Past Medical, Surgical, Social, and Family History Reviewed & Updated per EMR.   Pertinent Historical Findings include:  Past Medical History:  Diagnosis Date   Elbow effusion    R   Frequent headaches    Gout    toe   Hyperlipidemia    on meds   Hypertension    on meds   Migraines     Past Surgical History:  Procedure Laterality Date   TESTICLE TORSION REDUCTION     child    Family History  Problem Relation Age of Onset   Hypertension Mother    Hypertension Sister    Colon polyps Neg Hx    Colon cancer Neg Hx    Esophageal cancer Neg Hx    Stomach cancer Neg Hx    Rectal cancer Neg Hx     Social History   Socioeconomic History   Marital status: Single    Spouse name: Not on file   Number of children: 2   Years of education: Not on file   Highest education level: Not on file  Occupational History    Employer: [employer]    Comment: Banker  Tobacco Use   Smoking status: Never   Smokeless tobacco: Never  Vaping Use   Vaping Use: Never used  Substance and Sexual Activity   Alcohol use: Yes    Alcohol/week: 6.0 standard drinks    Types: 6 Standard drinks or equivalent per week   Drug use: No   Sexual activity: Never  Other Topics Concern   Not on file  Social History Narrative   Theodore Maldonado lives with his sister. He has two daughters. Works [employer].   Social Determinants of Health   Financial Resource Strain: Not on file  Food Insecurity: Not on file  Transportation Needs: Not on file  Physical Activity: Not on file  Stress: Not on file  Social Connections: Not on file  Intimate Partner Violence: Not on file     PHYSICAL EXAM:  VS: There were no vitals taken for this visit. Physical Exam Gen:  NAD, alert, cooperative with exam, well-appearing MSK:  ***      ASSESSMENT & PLAN:   No problem-specific Assessment & Plan notes found for this encounter.

## 2021-06-06 ENCOUNTER — Other Ambulatory Visit: Payer: Self-pay | Admitting: Medical

## 2021-07-09 ENCOUNTER — Telehealth: Payer: Self-pay | Admitting: Medical

## 2021-07-09 MED ORDER — LOSARTAN POTASSIUM 100 MG PO TABS: 100.0000 mg | ORAL_TABLET | Freq: Every day | ORAL | 3 refills | Status: DC

## 2021-07-09 NOTE — Telephone Encounter (Signed)
Pt. Wants to know can he receive another refill of medication. He will be going out of town and it will be time for his refill and he wont be in town to get it  #: [id number]  losartan (COZAAR) 100 MG tablet

## 2021-07-09 NOTE — Telephone Encounter (Signed)
Rx sent 

## 2021-07-12 ENCOUNTER — Other Ambulatory Visit: Payer: Self-pay

## 2021-07-12 ENCOUNTER — Ambulatory Visit (INDEPENDENT_AMBULATORY_CARE_PROVIDER_SITE_OTHER): Payer: BC Managed Care – PPO | Admitting: Family Medicine

## 2021-07-12 ENCOUNTER — Encounter: Payer: Self-pay | Admitting: Family Medicine

## 2021-07-12 ENCOUNTER — Ambulatory Visit: Payer: Self-pay

## 2021-07-12 VITALS — Ht 66.0 in | Wt 229.0 lb

## 2021-07-12 DIAGNOSIS — M25461 Effusion, right knee: Secondary | ICD-10-CM

## 2021-07-12 DIAGNOSIS — M25561 Pain in right knee: Secondary | ICD-10-CM

## 2021-07-12 MED ORDER — TRIAMCINOLONE ACETONIDE 40 MG/ML IJ SUSP
40.0000 mg | Freq: Once | INTRAMUSCULAR | Status: AC
  Administered 2021-07-12: 40 mg via INTRA_ARTICULAR

## 2021-07-12 NOTE — Progress Notes (Signed)
Theodore Maldonado - 50 y.o. male MRN [id number]  Date of birth: 10/01/1971  SUBJECTIVE:  Including CC & ROS.  No chief complaint on file.   Theodore Maldonado is a 50 y.o. male that is presenting with acute on chronic right knee swelling.  No injury or inciting event.  Has had previous effusions.   Review of Systems See HPI   HISTORY: Past Medical, Surgical, Social, and Family History Reviewed & Updated per EMR.   Pertinent Historical Findings include:  Past Medical History:  Diagnosis Date   Elbow effusion    R   Frequent headaches    Gout    toe   Hyperlipidemia    on meds   Hypertension    on meds   Migraines     Past Surgical History:  Procedure Laterality Date   TESTICLE TORSION REDUCTION     child    Family History  Problem Relation Age of Onset   Hypertension Mother    Hypertension Sister    Colon polyps Neg Hx    Colon cancer Neg Hx    Esophageal cancer Neg Hx    Stomach cancer Neg Hx    Rectal cancer Neg Hx     Social History   Socioeconomic History   Marital status: Married    Spouse name: Not on file   Number of children: 2   Years of education: Not on file   Highest education level: Not on file  Occupational History    Employer: [employer]    Comment: Banker  Tobacco Use   Smoking status: Never   Smokeless tobacco: Never  Vaping Use   Vaping Use: Never used  Substance and Sexual Activity   Alcohol use: Yes    Alcohol/week: 6.0 standard drinks    Types: 6 Standard drinks or equivalent per week   Drug use: No   Sexual activity: Never  Other Topics Concern   Not on file  Social History Narrative   Theodore Maldonado lives with his sister. He has two daughters. Works [employer].   Social Determinants of Health   Financial Resource Strain: Not on file  Food Insecurity: Not on file  Transportation Needs: Not on file  Physical Activity: Not on file  Stress: Not on file  Social Connections: Not on file  Intimate Partner Violence: Not on  file     PHYSICAL EXAM:  VS: Ht _0  (1.676 m)   Wt 229 lb (103.9 kg)   BMI 36.96 kg/m  Physical Exam Gen: NAD, alert, cooperative with exam, well-appearing   Limited ultrasound: Right knee:  Large effusion in suprapatellar pouch. Normal-appearing quadricep and patellar tendon. Mild medial joint space narrowing and lateral joint space narrowing  Summary: Large effusion  Ultrasound and interpretation by Clearance Coots, MD   Aspiration/Injection Procedure Note Theodore Maldonado May 27, 1971  Procedure: Aspiration and Injection Indications: Right knee pain  Procedure Details Consent: Risks of procedure as well as the alternatives and risks of each were explained to the (patient/caregiver).  Consent for procedure obtained. Time Out: Verified patient identification, verified procedure, site/side was marked, verified correct patient position, special equipment/implants available, medications/allergies/relevent history reviewed, required imaging and test results available.  Performed.  The area was cleaned with iodine and alcohol swabs.    The right knee superior lateral suprapatellar pouch was injected using 3 cc of 1% lidocaine on a 25-gauge 1-1/2 inch needle.  An 18-gauge 1-1/2 inch needle was used to achieve aspiration.  A mixture  of 1 cc's of 40 mg Kenalog and 4 cc's of 0.25% bupivacaine with a 25 1 1/2" needle was injected.  Ultrasound was used. Images were obtained in long views showing the injection.    Amount of Fluid Aspirated:  103m Character of Fluid: clear and straw colored Fluid was sent [id number]:HHIDcount and crystal identification  A sterile dressing was applied.  Patient did tolerate procedure well.     ASSESSMENT & PLAN:   Knee effusion, right Has had repeated significant effusions.  Unclear if this is strictly related to degenerative changes in around the knee or more of a inflammatory source. -Counseled on home exercise therapy and supportive  care. -Aspiration and injection. -Synovial fluid analysis. -Could consider imaging or lab work.

## 2021-07-12 NOTE — Patient Instructions (Signed)
Nice to meet you Please use ice as needed  Please consider compression   I will call with the results from today  Please send me a message in MyChart with any questions or updates.  Please see me back in 4 weeks.   --Dr. Raeford Razor

## 2021-07-12 NOTE — Assessment & Plan Note (Signed)
Has had repeated significant effusions.  Unclear if this is strictly related to degenerative changes in around the knee or more of a inflammatory source. -Counseled on home exercise therapy and supportive care. -Aspiration and injection. -Synovial fluid analysis. -Could consider imaging or lab work.

## 2021-07-12 NOTE — Addendum Note (Signed)
Addended by: Cresenciano Lick on: 07/12/2021 10:48 AM   Modules accepted: Orders

## 2021-07-13 LAB — SYNOVIAL FLUID ANALYSIS, COMPLETE
Basophils, %: 0 %
Eosinophils-Synovial: 0 % (ref 0–2)
Lymphocytes-Synovial Fld: 0 % (ref 0–74)
Monocyte/Macrophage: 58 % (ref 0–69)
Neutrophil, Synovial: 34 % — ABNORMAL HIGH (ref 0–24)
Synoviocytes, %: 8 % (ref 0–15)
WBC, Synovial: 570 cells/uL — ABNORMAL HIGH (ref <150)

## 2021-07-15 ENCOUNTER — Telehealth: Payer: Self-pay | Admitting: Family Medicine

## 2021-07-15 DIAGNOSIS — M10061 Idiopathic gout, right knee: Secondary | ICD-10-CM

## 2021-07-15 NOTE — Telephone Encounter (Signed)
Informed that his results are positive for urinary crystals.  We will check a uric acid.   Rosemarie Ax, MD [facility] 07/15/2021, 9:19 AM

## 2021-09-24 DIAGNOSIS — Z20822 Contact with and (suspected) exposure to covid-19: Secondary | ICD-10-CM | POA: Diagnosis not present

## 2022-02-04 ENCOUNTER — Other Ambulatory Visit: Payer: Self-pay | Admitting: Medical

## 2022-03-06 ENCOUNTER — Other Ambulatory Visit: Payer: Self-pay | Admitting: Medical

## 2022-03-07 ENCOUNTER — Ambulatory Visit: Payer: BC Managed Care – PPO | Admitting: Medical

## 2022-03-07 VITALS — BP 130/84 | HR 72 | Resp 18 | Ht 66.0 in | Wt 227.0 lb

## 2022-03-07 DIAGNOSIS — R739 Hyperglycemia, unspecified: Secondary | ICD-10-CM

## 2022-03-07 DIAGNOSIS — E790 Hyperuricemia without signs of inflammatory arthritis and tophaceous disease: Secondary | ICD-10-CM

## 2022-03-07 DIAGNOSIS — I10 Essential (primary) hypertension: Secondary | ICD-10-CM

## 2022-03-07 DIAGNOSIS — Z7185 Encounter for immunization safety counseling: Secondary | ICD-10-CM

## 2022-03-07 DIAGNOSIS — M1000 Idiopathic gout, unspecified site: Secondary | ICD-10-CM

## 2022-03-07 DIAGNOSIS — R748 Abnormal levels of other serum enzymes: Secondary | ICD-10-CM | POA: Diagnosis not present

## 2022-03-07 NOTE — Progress Notes (Signed)
? ?Subjective:  ? ? Patient ID: Theodore Maldonado, male    DOB: 1971/03/27, 51 y.o.   MRN: [id number] ? ?HPI ?Here for follow up on high blood pressure. He is checking bp about once a week. He states often will get bp in range of 130/80.  Pt stats has been trying to exercise more.  ? ? ?Also pt has knee pain with some swelling in past. Pt had studies done of fluid drawn off with showed gout. ? ?Mild elevated sugar in the past.  ? ?Elevated liver enzymes in past. One beer on Monday. Will repeat lab today. ? ? ? ?Review of Systems  ?Constitutional:  Negative for chills, fatigue and fever.  ?HENT:  Negative for dental problem and ear pain.   ?Respiratory:  Negative for chest tightness, shortness of breath and wheezing.   ?Cardiovascular:  Negative for chest pain and palpitations.  ?Gastrointestinal:  Negative for abdominal pain and blood in stool.  ?Genitourinary:  Negative for difficulty urinating, enuresis, flank pain and frequency.  ?Musculoskeletal:  Negative for back pain.  ?     Rt thigh bulge with tightness. No knee pain. No other joint region pains.  ?Neurological:  Negative for dizziness and headaches.  ?Hematological:  Negative for adenopathy. Does not bruise/bleed easily.  ?Psychiatric/Behavioral:  Negative for behavioral problems and decreased concentration.   ? ? ?Past Medical History:  ?Diagnosis Date  ? Elbow effusion   ? R  ? Frequent headaches   ? Gout   ? toe  ? Hyperlipidemia   ? on meds  ? Hypertension   ? on meds  ? Migraines   ? ?  ?Social History  ? ?Socioeconomic History  ? Marital status: Married  ?  Spouse name: Not on file  ? Number of children: 2  ? Years of education: Not on file  ? Highest education level: Not on file  ?Occupational History  ?  Employer: [employer]  ?  Comment: Banker  ?Tobacco Use  ? Smoking status: Never  ? Smokeless tobacco: Never  ?Vaping Use  ? Vaping Use: Never used  ?Substance and Sexual Activity  ? Alcohol use: Yes  ?  Alcohol/week: 6.0 standard drinks   ?  Types: 6 Standard drinks or equivalent per week  ? Drug use: No  ? Sexual activity: Never  ?Other Topics Concern  ? Not on file  ?Social History Narrative  ? Mr. Lopezgarcia lives with his sister. He has two daughters. Works [employer].  ? ?Social Determinants of Health  ? ?Financial Resource Strain: Not on file  ?Food Insecurity: Not on file  ?Transportation Needs: Not on file  ?Physical Activity: Not on file  ?Stress: Not on file  ?Social Connections: Not on file  ?Intimate Partner Violence: Not on file  ? ? ?Past Surgical History:  ?Procedure Laterality Date  ? TESTICLE TORSION REDUCTION    ? child  ? ? ?Family History  ?Problem Relation Age of Onset  ? Hypertension Mother   ? Hypertension Sister   ? Colon polyps Neg Hx   ? Colon cancer Neg Hx   ? Esophageal cancer Neg Hx   ? Stomach cancer Neg Hx   ? Rectal cancer Neg Hx   ? ? ?No Known Allergies ? ?Current Outpatient Medications on File Prior to Visit  ?Medication Sig Dispense Refill  ? amLODipine (NORVASC) 5 MG tablet TAKE 1 TABLET (5 MG TOTAL) BY MOUTH DAILY. 30 tablet 0  ? losartan (COZAAR) 100 MG tablet  Take 1 tablet (100 mg total) by mouth daily. 90 tablet 3  ? losartan (COZAAR) 100 MG tablet TAKE 1 TABLET (100 MG TOTAL) BY MOUTH DAILY. 90 tablet 3  ? ?Current Facility-Administered Medications on File Prior to Visit  ?Medication Dose Route Frequency Provider Last Rate Last Admin  ? 0.9 %  sodium chloride infusion  500 mL Intravenous Once Doran Stabler, MD      ? ? ?BP 130/84   Pulse 72   Resp 18   Ht _0  (1.676 m)   Wt 227 lb (103 kg)   SpO2 98%   BMI 36.64 kg/m?  ?  ?   ?Objective:  ? Physical Exam ? ?General- No acute distress. Pleasant patient. ?Neck- Full range of motion, no jvd ?Lungs- Clear, even and unlabored. ?Heart- regular rate and rhythm. ?Neurologic- CNII- XII grossly intact.  ? ?Rt knee- from, no instability and  no warmth.distal rt thigh area swelling. ? ? ? ?   ?Assessment & Plan:  ? ?Patient Instructions  ?Htn- bp well controlled.  Continue losartan and amlodipine. ? ?For elevated liver enzymes repeat metabolic panel today. ? ?Mild elevated sugar. Will get a1c. Recommend diet and exercise. ? ?For gout will get uric acid level. If you have gout like pain let me know and will rx meds. Presently may refer to rheumatologist but want to wait on uric acid level before deciding. ? ?Answered questions on covid vaccine. ? ?Follow up in 6 months or sooner if needed. ? ? ? ?  ?Mackie Pai, PA-C  ?

## 2022-03-07 NOTE — Addendum Note (Signed)
Addended by: Laure Kidney on: 03/07/2022 01:57 PM ? ? Modules accepted: Orders ? ?

## 2022-03-07 NOTE — Patient Instructions (Addendum)
Htn- bp well controlled. Continue losartan and amlodipine. ? ?For elevated liver enzymes repeat metabolic panel today. ? ?Mild elevated sugar. Will get a1c. Recommend diet and exercise. ? ?For gout will get uric acid level. If you have gout like pain let me know and will rx meds. Presently may refer to rheumatologist but want to wait on uric acid level before deciding. ? ?Answered questions on covid vaccine. ? ?Follow up in 6 months or sooner if needed. ? ? ? ? ?

## 2022-03-08 LAB — HEMOGLOBIN A1C
Hgb A1c MFr Bld: 5.5 % of total Hgb (ref <5.7)
Mean Plasma Glucose: 111 mg/dL
eAG (mmol/L): 6.2 mmol/L

## 2022-03-08 LAB — COMPREHENSIVE METABOLIC PANEL
AG Ratio: 1.2 (calc) (ref 1.0–2.5)
ALT: 34 U/L (ref 9–46)
AST: 23 U/L (ref 10–40)
Albumin: 4.3 g/dL (ref 3.6–5.1)
Alkaline phosphatase (APISO): 60 U/L (ref 36–130)
BUN: 14 mg/dL (ref 7–25)
CO2: 23 mmol/L (ref 20–32)
Calcium: 9.8 mg/dL (ref 8.6–10.3)
Chloride: 108 mmol/L (ref 98–110)
Creat: 1.29 mg/dL (ref 0.60–1.29)
Globulin: 3.7 g/dL (calc) (ref 1.9–3.7)
Glucose, Bld: 87 mg/dL (ref 65–99)
Potassium: 4.5 mmol/L (ref 3.5–5.3)
Sodium: 141 mmol/L (ref 135–146)
Total Bilirubin: 0.5 mg/dL (ref 0.2–1.2)
Total Protein: 8 g/dL (ref 6.1–8.1)

## 2022-03-08 LAB — URIC ACID: Uric Acid, Serum: 7.5 mg/dL (ref 4.0–8.0)

## 2022-03-09 NOTE — Addendum Note (Signed)
Addended by: Anabel Halon on: 03/09/2022 03:25 PM ? ? Modules accepted: Orders ? ?

## 2022-04-03 ENCOUNTER — Other Ambulatory Visit: Payer: Self-pay | Admitting: Medical

## 2022-04-26 ENCOUNTER — Other Ambulatory Visit: Payer: Self-pay | Admitting: Medical

## 2022-05-29 ENCOUNTER — Other Ambulatory Visit: Payer: Self-pay | Admitting: Medical

## 2022-07-14 ENCOUNTER — Other Ambulatory Visit: Payer: Self-pay | Admitting: Medical

## 2022-07-18 ENCOUNTER — Ambulatory Visit: Payer: BC Managed Care – PPO | Admitting: Internal Medicine

## 2022-09-05 ENCOUNTER — Ambulatory Visit: Payer: BC Managed Care – PPO | Admitting: Medical

## 2022-10-03 ENCOUNTER — Ambulatory Visit: Payer: BC Managed Care – PPO | Admitting: Internal Medicine

## 2022-12-15 ENCOUNTER — Other Ambulatory Visit: Payer: Self-pay | Admitting: Medical

## 2023-02-09 ENCOUNTER — Encounter: Payer: Self-pay | Admitting: *Deleted

## 2023-06-14 ENCOUNTER — Other Ambulatory Visit: Payer: Self-pay | Admitting: Medical

## 2023-06-17 ENCOUNTER — Other Ambulatory Visit: Payer: Self-pay | Admitting: Medical

## 2023-08-11 ENCOUNTER — Other Ambulatory Visit: Payer: Self-pay

## 2023-08-11 ENCOUNTER — Emergency Department (HOSPITAL_BASED_OUTPATIENT_CLINIC_OR_DEPARTMENT_OTHER)
Admission: EM | Admit: 2023-08-11 | Discharge: 2023-08-11 | Disposition: A | Discharge status: Home / Self Care | Payer: BC Managed Care – PPO | Attending: Emergency Medicine | Admitting: Emergency Medicine

## 2023-08-11 ENCOUNTER — Encounter (HOSPITAL_BASED_OUTPATIENT_CLINIC_OR_DEPARTMENT_OTHER): Payer: Self-pay | Admitting: Urology

## 2023-08-11 DIAGNOSIS — Z1152 Encounter for screening for COVID-19: Secondary | ICD-10-CM | POA: Insufficient documentation

## 2023-08-11 DIAGNOSIS — H66002 Acute suppurative otitis media without spontaneous rupture of ear drum, left ear: Secondary | ICD-10-CM | POA: Insufficient documentation

## 2023-08-11 DIAGNOSIS — H9202 Otalgia, left ear: Secondary | ICD-10-CM | POA: Diagnosis not present

## 2023-08-11 LAB — RESP PANEL BY RT-PCR (RSV, FLU A&B, COVID)  RVPGX2
Influenza A by PCR: NEGATIVE
Influenza B by PCR: NEGATIVE
Resp Syncytial Virus by PCR: NEGATIVE
SARS Coronavirus 2 by RT PCR: NEGATIVE

## 2023-08-11 LAB — GROUP A STREP BY PCR: Group A Strep by PCR: NOT DETECTED

## 2023-08-11 MED ORDER — AMOXICILLIN 875 MG PO TABS
875.0000 mg | ORAL_TABLET | Freq: Two times a day (BID) | ORAL | 0 refills | Status: AC
Start: 2023-08-11 — End: 2023-08-18

## 2023-08-11 NOTE — ED Triage Notes (Signed)
Pt states runny nose, congestion, sore throat, headache, left ear pain that started Friday  Denies fever

## 2023-08-11 NOTE — Discharge Instructions (Addendum)
You were seen in the ER for flu-like symptoms. You were negative on COVID, RSV, flu, and strep. It is likely you developed some other virus which has now progressed to an ear infection on the left side. I have sent a prescription for antibiotics to take for the next week. You can continue to take Mucinex and cold/flu medication for symptom control if needed. If symptoms worsen, return to the ER. Otherwise, follow up with your primary care provider.

## 2023-08-18 ENCOUNTER — Ambulatory Visit: Payer: BC Managed Care – PPO | Admitting: Medical

## 2023-08-18 ENCOUNTER — Encounter: Payer: Self-pay | Admitting: Medical

## 2023-08-18 VITALS — BP 123/61 | HR 71 | Temp 99.0°F | Ht 66.0 in | Wt 224.4 lb

## 2023-08-18 DIAGNOSIS — H6690 Otitis media, unspecified, unspecified ear: Secondary | ICD-10-CM | POA: Diagnosis not present

## 2023-08-18 DIAGNOSIS — H6122 Impacted cerumen, left ear: Secondary | ICD-10-CM

## 2023-08-18 DIAGNOSIS — J069 Acute upper respiratory infection, unspecified: Secondary | ICD-10-CM

## 2023-08-18 MED ORDER — FLUTICASONE PROPIONATE 50 MCG/ACT NA SUSP
2.0000 | Freq: Every day | NASAL | 1 refills | Status: DC
Start: 2023-08-18 — End: 2023-09-09

## 2023-08-18 MED ORDER — CEFTRIAXONE SODIUM 1 G IJ SOLR
1.0000 g | Freq: Once | INTRAMUSCULAR | Status: AC
Start: 2023-08-18 — End: 2023-08-18
  Administered 2023-08-18: 1 g via INTRAMUSCULAR

## 2023-08-18 MED ORDER — AMOXICILLIN-POT CLAVULANATE 875-125 MG PO TABS: 1.0000 | ORAL_TABLET | Freq: Two times a day (BID) | ORAL | 0 refills | Status: DC

## 2023-08-18 MED ORDER — BENZONATATE 100 MG PO CAPS: 100.0000 mg | ORAL_CAPSULE | Freq: Three times a day (TID) | ORAL | 0 refills | Status: AC | PRN

## 2023-08-18 NOTE — Addendum Note (Signed)
Addended by: Juel Burrow on: 08/18/2023 03:58 PM   Modules accepted: Orders

## 2023-08-18 NOTE — Progress Notes (Signed)
Subjective:    Patient ID: Theodore Maldonado, male    DOB: 05-03-1971, 52 y.o.   MRN: [id number]  HPI  Discussed the use of AI scribe software for clinical note transcription with the patient, who gave verbal consent to proceed.  History of Present Illness   The patient, with a recent history of an upper respiratory infection, presented to the emergency department approximately 1 weeks ago with sudden onset of fatigue, chills, and nasal congestion. He initially attributed these symptoms to weather changes and attempted self-treatment over the weekend. However, the symptoms persisted, and he began to experience a sensation of pressure in the left ear, which was not initially bothersome. The patient denied any sore throat but reported a mild, intermittent ache in the left ear, which seemed to be position-dependent. He described the current sensation in the ear as muffled, akin to having a cotton ball stuck in the ear.    Despite nearing the end of this antibiotic course, the patient reported no significant improvement in his ear symptoms. He underwent several tests, including a strep test, a respiratory panel, and a COVID test, all of which returned negative results.       Review of Systems  Constitutional:  Negative for chills, fatigue and fever.  HENT:  Positive for congestion and ear pain. Negative for sinus pressure and sinus pain.   Respiratory:  Negative for chest tightness, shortness of breath and wheezing.   Cardiovascular:  Negative for chest pain and palpitations.  Gastrointestinal:  Negative for abdominal pain.  Musculoskeletal:  Negative for back pain and joint swelling.  Skin:  Negative for pallor.  Neurological:  Negative for dizziness, seizures, weakness and light-headedness.  Hematological:  Negative for adenopathy. Does not bruise/bleed easily.  Psychiatric/Behavioral:  Negative for behavioral problems and dysphoric mood.     Past Medical History:  Diagnosis Date    Elbow effusion    R   Frequent headaches    Gout    toe   Hyperlipidemia    on meds   Hypertension    on meds   Migraines      Social History   Socioeconomic History   Marital status: Married    Spouse name: Not on file   Number of children: 2   Years of education: Not on file   Highest education level: Not on file  Occupational History    Employer: [employer]    Comment: Network engineer  Tobacco Use   Smoking status: Never   Smokeless tobacco: Never  Vaping Use   Vaping status: Never Used  Substance and Sexual Activity   Alcohol use: Yes    Alcohol/week: 6.0 standard drinks of alcohol    Types: 6 Standard drinks or equivalent per week   Drug use: No   Sexual activity: Never  Other Topics Concern   Not on file  Social History Narrative   Mr. Reynen lives with his sister. He has two daughters. Works [employer].   Social Determinants of Health   Financial Resource Strain: Not on file  Food Insecurity: Not on file  Transportation Needs: Not on file  Physical Activity: Not on file  Stress: Not on file  Social Connections: Not on file  Intimate Partner Violence: Not on file    Past Surgical History:  Procedure Laterality Date   TESTICLE TORSION REDUCTION     child    Family History  Problem Relation Age of Onset   Hypertension Mother    Hypertension Sister  Colon polyps Neg Hx    Colon cancer Neg Hx    Esophageal cancer Neg Hx    Stomach cancer Neg Hx    Rectal cancer Neg Hx     No Known Allergies  Current Outpatient Medications on File Prior to Visit  Medication Sig Dispense Refill   amLODipine (NORVASC) 5 MG tablet TAKE 1 TABLET (5 MG TOTAL) BY MOUTH DAILY. 90 tablet 1   amoxicillin (AMOXIL) 875 MG tablet Take 1 tablet (875 mg total) by mouth 2 (two) times daily for 7 days. 14 tablet 0   losartan (COZAAR) 100 MG tablet TAKE 1 TABLET BY MOUTH EVERY DAY 90 tablet 3   Current Facility-Administered Medications on File Prior to Visit  Medication Dose  Route Frequency Provider Last Rate Last Admin   0.9 %  sodium chloride infusion  500 mL Intravenous Once Danis, Starr Lake III, MD        BP 123/61   Pulse 71   Temp 99 F (37.2 C)   Ht 5\' 6"  (1.676 m)   Wt 224 lb 6.4 oz (101.8 kg)   SpO2 97%   BMI 36.22 kg/m        Objective:   Physical Exam General- No acute distress. Pleasant patient. Neck- Full range of motion, no jvd Lungs- Clear, even and unlabored. Heart- regular rate and rhythm. Neurologic- CNII- XII grossly intact.  Heent-no sinus pressure. Normal pharynx. Left ear- canal clear and tm normal. Rt ear- moderate to severe wax. I can't see tm.         Assessment & Plan:   Assessment and Plan    Upper Respiratory Infection Initial presentation with fatigue, chills, and congestion. No evidence of strep throat or COVID-19 on testing. -flonase for nasal congestion and benzonatate for cough  Left Otitis Media Initial treatment with Amoxicillin 875mg  for 7 days with minimal improvement. Moderate to severe cerumen impaction noted, limiting visualization of tympanic membrane. -Discontinue Amoxicillin. -Start Augmentin for 5 days. -Administer 1g of rocephin 1 gram -Use Debrox drops twice daily for 5 days to soften cerumen. -Schedule follow-up in 1 week to reassess and potentially irrigate cerumen if infection has resolved.

## 2023-08-18 NOTE — Patient Instructions (Signed)
Upper Respiratory Infection Initial presentation with fatigue, chills, and congestion. No evidence of strep throat or COVID-19 on testing. -flonase for nasal congestion and benzonatate for cough  Left Otitis Media Initial treatment with Amoxicillin 875mg  for 7 days with minimal improvement. Moderate to severe cerumen impaction noted, limiting visualization of tympanic membrane. -Discontinue Amoxicillin. -Start Augmentin for 5 days. -Administer 1g of rocephin 1 gram -Use Debrox drops twice daily for 5 days to soften cerumen. -Schedule follow-up in 1 week to reassess and potentially irrigate cerumen if infection has resolved.

## 2023-08-19 NOTE — ED Provider Notes (Signed)
[place] [facility] Provider Note   CSN: [id number] Arrival date & time: 08/11/23  1722     History Chief Complaint  Patient presents with   Flu like Symptoms     Theodore Maldonado is a 52 y.o. male. Patient presented to the ED with concerns of flu-like symptoms. States that these symptoms began about 3 days ago without notable improvement during this time. No obvious sick contacts. Also endorsing associated headache, sore throat, left ear pain, and nasal congestion. Denies any facial congestion. No chest pain, shortness of breath, hemoptysis, nausea, vomiting, diarrhea, or abdominal pain. Has tried taking OTC cough medicine without improvement. No fevers or chills.  HPI     Home Medications Prior to Admission medications   Medication Sig Start Date End Date Taking? Authorizing Provider  amLODipine (NORVASC) 5 MG tablet TAKE 1 TABLET (5 MG TOTAL) BY MOUTH DAILY. 06/17/23   Saguier, Ramon Dredge, PA-C  amoxicillin-clavulanate (AUGMENTIN) 875-125 MG tablet Take 1 tablet by mouth 2 (two) times daily. 08/18/23   Saguier, Ramon Dredge, PA-C  benzonatate (TESSALON) 100 MG capsule Take 1 capsule (100 mg total) by mouth 3 (three) times daily as needed for cough. 08/18/23   Saguier, Ramon Dredge, PA-C  fluticasone (FLONASE) 50 MCG/ACT nasal spray Place 2 sprays into both nostrils daily. 08/18/23   Saguier, Ramon Dredge, PA-C  losartan (COZAAR) 100 MG tablet TAKE 1 TABLET BY MOUTH EVERY DAY 06/15/23   Saguier, Ramon Dredge, PA-C      Allergies    Patient has no known allergies.    Review of Systems   Review of Systems  Constitutional:  Positive for fatigue.  HENT:  Positive for congestion.   Respiratory:  Positive for cough.     Physical Exam Updated Vital Signs BP (!) 150/91 (BP Location: Left Arm)   Pulse 74   Temp 98.6 F (37 C)   Resp 18   Ht 5\' 6"  (1.676 m)   Wt 101.6 kg   SpO2 99%   BMI 36.15 kg/m  Physical Exam Vitals and nursing note reviewed.   Constitutional:      General: He is not in acute distress.    Appearance: He is well-developed.  HENT:     Head: Normocephalic and atraumatic.     Right Ear: Tympanic membrane and ear canal normal. No middle ear effusion. Tympanic membrane is not injected or bulging.     Left Ear: A middle ear effusion is present. Tympanic membrane is injected and bulging.  Eyes:     Conjunctiva/sclera: Conjunctivae normal.  Cardiovascular:     Rate and Rhythm: Normal rate and regular rhythm.     Heart sounds: No murmur heard. Pulmonary:     Effort: Pulmonary effort is normal. No respiratory distress.     Breath sounds: Normal breath sounds.  Abdominal:     Palpations: Abdomen is soft.     Tenderness: There is no abdominal tenderness.  Musculoskeletal:        General: No swelling.     Cervical back: Neck supple.  Skin:    General: Skin is warm and dry.     Capillary Refill: Capillary refill takes less than 2 seconds.  Neurological:     Mental Status: He is alert.  Psychiatric:        Mood and Affect: Mood normal.     ED Results / Procedures / Treatments   Labs (all labs ordered are listed, but only abnormal results are displayed) Labs Reviewed  RESP PANEL  BY RT-PCR (RSV, FLU A&B, COVID)  RVPGX2  GROUP A STREP BY PCR    EKG None  Radiology No results found.  Procedures Procedures   Medications Ordered in ED Medications - No data to display  ED Course/ Medical Decision Making/ A&P                               Medical Decision Making Risk Prescription drug management.   This patient presents to the ED for concern of flu-like illness. Differential diagnosis includes viral URI, pneumonia, otitis media, pharyngitis   Lab Tests:  I Ordered, and personally interpreted labs.  The pertinent results include:  Group A strep negative, respiratory viral panel negative   Problem List / ED Course:  Patient is a 52 year old male with concerns of flu-like illness. Has been  experiencing congestion, sore throat, runny nose, and left ear pain for about 3 or 4 days. No fevers or chills. Also denying chest pain, shortness of breath, hemoptysis, or any gastrointestinal symptoms. Respiratory viral panel and strep ordered from triage for assessment of patient's symptoms. Physical exam largely reassuring but reveals an injected and bulging TM in the left ear with notable middle ear effusion. Right ear is unremarkable. Given findings and symptoms, suspect likely due to acute otitis media. Will start patient on amoxicillin for this and have him follow up with PCP to ensure symptoms are improving/resolving. Given no other acute components to complaint such as chest pain, SOB, vomiting, or diarrhea, do not feel that further workup is needed at this time. Advised strict return precautions with patient. Patient in agreement with treatment plan and verbalized understanding strict return precautions. All questions answered prior to patient discharge. Patient discharged home in stable condition  Final Clinical Impression(s) / ED Diagnoses Final diagnoses:  Non-recurrent acute suppurative otitis media of left ear without spontaneous rupture of tympanic membrane    Rx / DC Orders ED Discharge Orders          Ordered    amoxicillin (AMOXIL) 875 MG tablet  2 times daily        08/11/23 1837              Smitty Knudsen, PA-C 08/19/23 1001    Arby Barrette, MD 08/24/23 1156

## 2023-08-28 ENCOUNTER — Ambulatory Visit: Payer: BC Managed Care – PPO | Admitting: Medical

## 2023-08-28 VITALS — Resp 18 | Ht 66.0 in | Wt 225.0 lb

## 2023-08-28 DIAGNOSIS — H6690 Otitis media, unspecified, unspecified ear: Secondary | ICD-10-CM | POA: Diagnosis not present

## 2023-08-28 MED ORDER — AMOXICILLIN-POT CLAVULANATE 875-125 MG PO TABS: 1.0000 | ORAL_TABLET | Freq: Two times a day (BID) | ORAL | 0 refills | Status: AC

## 2023-08-28 MED ORDER — CEFTRIAXONE SODIUM 1 G IJ SOLR
1.0000 g | Freq: Once | INTRAMUSCULAR | Status: AC
Start: 2023-08-28 — End: 2023-08-28
  Administered 2023-08-28: 1 g via INTRAMUSCULAR

## 2023-08-28 NOTE — Addendum Note (Signed)
Addended by: Maximino Sarin on: 08/28/2023 04:24 PM   Modules accepted: Orders

## 2023-08-28 NOTE — Progress Notes (Signed)
   Subjective:    Patient ID: Theodore Maldonado, male    DOB: 1971-05-29, 52 y.o.   MRN: [id number]  HPI Discussed the use of AI scribe software for clinical note transcription with the patient, who gave verbal consent to proceed.  History of Present Illness   The patient, previously diagnosed with an ear infection, presented for a follow-up visit. Initially, he experienced cold-like symptoms which progressed to an ear infection. Despite a seven-day course of amoxicillin, the patient continued to experience left ear pain. Upon examination, the left ear had moderate to severe wax, obscuring the tympanic membrane. The patient was then administered a stronger antibiotic, including an injection, and used Debrox to soften the wax.  At the follow-up, the patient reported no pain but described a persistent sensation of clogging in the ear. He also reported a decrease in hearing, often having to project his voice to hear himself speak. Despite two rounds of antibiotics and a Rocephin injection, the left ear infection persisted. The patient noted some improvement in wax clearance after using Debrox, but the eardrum remained red with a slight bulge.        Review of Systems See hpi    Objective:   Physical Exam  Physical Exam General- No acute distress. Pleasant patient. Neck- Full range of motion, no jvd Lungs- Clear, even and unlabored. Heart- regular rate and rhythm. Neurologic- CNII- XII grossly intact.  Heent-no sinus pressure. Normal pharynx. Rt ear- canal clear and tm normal. lt ear- moderate  wax. Enter portion of wax clear.can see the tm bright red in center with bulge.      Assessment & Plan:  Assessment and Plan    Left Otitis Media Persistent despite two rounds of antibiotics and Rocephin injection. Eardrum is red with some bulging. Patient reports feeling of clogged ear and decreased hearing. -Start Augmentin for an additional 5 days.rocephin 1 gram today -Refer to ENT with  request for appointment by Friday of next week. -Advise patient to contact office if symptoms worsen before ENT appointment.       Esperanza Richters, PA-C

## 2023-08-28 NOTE — Patient Instructions (Signed)
Left Otitis Media Persistent despite two rounds of antibiotics and Rocephin injection. Eardrum is red with some bulging. Patient reports feeling of clogged ear and decreased hearing. -Start Augmentin for an additional 5 days.rocephin 1 gram today -Refer to ENT with request for appointment by Friday of next week. -Advise patient to contact office if symptoms worsen before ENT appointment.

## 2023-09-09 ENCOUNTER — Encounter (INDEPENDENT_AMBULATORY_CARE_PROVIDER_SITE_OTHER): Payer: Self-pay | Admitting: Otolaryngology

## 2023-09-09 ENCOUNTER — Other Ambulatory Visit: Payer: Self-pay | Admitting: Medical

## 2023-12-02 ENCOUNTER — Encounter (HOSPITAL_BASED_OUTPATIENT_CLINIC_OR_DEPARTMENT_OTHER): Payer: Self-pay | Admitting: Radiology

## 2023-12-02 ENCOUNTER — Other Ambulatory Visit: Payer: Self-pay

## 2023-12-02 ENCOUNTER — Emergency Department (HOSPITAL_BASED_OUTPATIENT_CLINIC_OR_DEPARTMENT_OTHER)
Admission: EM | Admit: 2023-12-02 | Discharge: 2023-12-02 | Disposition: A | Discharge status: Home / Self Care | Payer: BC Managed Care – PPO | Attending: Emergency Medicine | Admitting: Emergency Medicine

## 2023-12-02 DIAGNOSIS — Z79899 Other long term (current) drug therapy: Secondary | ICD-10-CM | POA: Diagnosis not present

## 2023-12-02 DIAGNOSIS — I10 Essential (primary) hypertension: Secondary | ICD-10-CM | POA: Diagnosis not present

## 2023-12-02 DIAGNOSIS — R059 Cough, unspecified: Secondary | ICD-10-CM | POA: Diagnosis not present

## 2023-12-02 DIAGNOSIS — J111 Influenza due to unidentified influenza virus with other respiratory manifestations: Secondary | ICD-10-CM | POA: Diagnosis not present

## 2023-12-02 DIAGNOSIS — Z20822 Contact with and (suspected) exposure to covid-19: Secondary | ICD-10-CM | POA: Insufficient documentation

## 2023-12-02 LAB — RESP PANEL BY RT-PCR (RSV, FLU A&B, COVID)  RVPGX2
Influenza A by PCR: POSITIVE — AB
Influenza B by PCR: NEGATIVE
Resp Syncytial Virus by PCR: NEGATIVE
SARS Coronavirus 2 by RT PCR: NEGATIVE

## 2023-12-02 MED ORDER — ACETAMINOPHEN 500 MG PO TABS
1000.0000 mg | ORAL_TABLET | Freq: Once | ORAL | Status: AC
  Administered 2023-12-02: 1000 mg via ORAL

## 2023-12-02 MED ORDER — ACETAMINOPHEN 500 MG PO TABS
ORAL_TABLET | ORAL | Status: AC
  Filled 2023-12-02: qty 2

## 2023-12-02 MED ORDER — KETOROLAC TROMETHAMINE 15 MG/ML IJ SOLN
15.0000 mg | Freq: Once | INTRAMUSCULAR | Status: AC
  Administered 2023-12-02: 15 mg via INTRAMUSCULAR
  Filled 2023-12-02: qty 1

## 2023-12-02 NOTE — ED Provider Notes (Signed)
 [place] [facility] Provider Note   CSN: [id number] Arrival date & time: 12/02/23  1748     History  Chief Complaint  Patient presents with   flu like symptoms    Theodore Maldonado is a 53 y.o. male with PMHx headaches, gout, HLD, HTN who presents to ED concerned for fever, chills, body aches, cough that started today. Patient stating that someone at work was diagnosed with Flu yesterday. Denies nausea, vomiting, diarrhea.  HPI     Home Medications Prior to Admission medications   Medication Sig Start Date End Date Taking? Authorizing Provider  amLODipine  (NORVASC ) 5 MG tablet TAKE 1 TABLET (5 MG TOTAL) BY MOUTH DAILY. 06/17/23   Saguier, [place], PA-C  amoxicillin -clavulanate (AUGMENTIN ) 875-125 MG tablet Take 1 tablet by mouth 2 (two) times daily. 08/28/23   Saguier, Dallas, PA-C  benzonatate  (TESSALON ) 100 MG capsule Take 1 capsule (100 mg total) by mouth 3 (three) times daily as needed for cough. 08/18/23   Saguier, [place], PA-C  fluticasone  (FLONASE ) 50 MCG/ACT nasal spray SPRAY 2 SPRAYS INTO EACH NOSTRIL EVERY DAY 09/09/23   Saguier, Dallas, PA-C  losartan  (COZAAR ) 100 MG tablet TAKE 1 TABLET BY MOUTH EVERY DAY 06/15/23   Saguier, Dallas, PA-C      Allergies    Patient has no known allergies.    Review of Systems   Review of Systems  Constitutional:  Positive for fever.    Physical Exam Updated Vital Signs BP 118/75 (BP Location: Right Arm)   Pulse 79   Temp 99.4 F (37.4 C) (Oral)   Resp 16   Ht 5' 6 (1.676 m)   Wt 102.1 kg   SpO2 100%   BMI 36.32 kg/m  Physical Exam Vitals and nursing note reviewed.  Constitutional:      General: He is not in acute distress.    Appearance: He is not ill-appearing or toxic-appearing.  HENT:     Head: Normocephalic and atraumatic.     Mouth/Throat:     Mouth: Mucous membranes are moist.  Eyes:     General: No scleral icterus.       Right eye: No discharge.        Left eye: No  discharge.     Conjunctiva/sclera: Conjunctivae normal.  Cardiovascular:     Rate and Rhythm: Normal rate and regular rhythm.     Pulses: Normal pulses.     Heart sounds: Normal heart sounds. No murmur heard. Pulmonary:     Effort: Pulmonary effort is normal. No respiratory distress.     Breath sounds: Normal breath sounds. No wheezing, rhonchi or rales.  Abdominal:     General: Abdomen is flat. Bowel sounds are normal.     Palpations: Abdomen is soft.  Musculoskeletal:     Right lower leg: No edema.     Left lower leg: No edema.  Skin:    General: Skin is warm and dry.     Findings: No rash.  Neurological:     General: No focal deficit present.     Mental Status: He is alert and oriented to person, place, and time. Mental status is at baseline.  Psychiatric:        Mood and Affect: Mood normal.        Behavior: Behavior normal.     ED Results / Procedures / Treatments   Labs (all labs ordered are listed, but only abnormal results are displayed) Labs Reviewed  RESP PANEL BY  RT-PCR (RSV, FLU A&B, COVID)  RVPGX2 - Abnormal; Notable for the following components:      Result Value   Influenza A by PCR POSITIVE (*)    All other components within normal limits    EKG None  Radiology No results found.  Procedures Procedures    Medications Ordered in ED Medications  acetaminophen  (TYLENOL ) 500 MG tablet (  Not Given 12/02/23 1816)  acetaminophen  (TYLENOL ) tablet 1,000 mg (1,000 mg Oral Given 12/02/23 1759)  ketorolac  (TORADOL ) 15 MG/ML injection 15 mg (15 mg Intramuscular Given 12/02/23 2211)    ED Course/ Medical Decision Making/ A&P                                 Medical Decision Making Risk OTC drugs.    This patient presents to the ED for concern of cough, fever, body aches, this involves an extensive number of treatment options, and is a complaint that carries with it a high risk of complications and morbidity.  The differential diagnosis includes  Flu/COVID/RSV,  pneumonia, meningitis.   Co morbidities that complicate the patient evaluation  headaches, gout, HLD, HTN   Additional history obtained:  Dr. Clide PCP    Problem List / ED Course / Critical interventions / Medication management  Patient presents to ED concern for cough, rhinorrhea, fever, headache, body aches that started today.  Stating that his coworker was diagnosed with flu yesterday.  Denies any nausea, vomiting, diarrhea.  Physical exam unremarkable.  Patient initially febrile at 102F which resolved with tylenol .  I Ordered, and personally interpreted labs.  Respiratory panel positive for flu A. Patient was educated on alternating between 650 mg Tylenol  and 400 mg ibuprofen every 3 hours as needed for pain. Offered to prescribe Tamiflu which patient declined.  Will provide patient with a Toradol  shot to help with bodyaches.  Recommended following up with PCP.  Patient verbalized understanding of plan. I have reviewed the patients home medicines and have made adjustments as needed Patient afebrile with stable vitals.  Provided with return precautions.  Discharged in good condition.   DDx: These are considered less likely due to history of present illness and physical exam findings -PNA: Lungs clear to auscultation bilaterally -Meningitis: patient's symptoms, vital signs, physical exam findings including lack of meningismus seem grossly less consistent at this time    Social Determinants of Health:  none         Final Clinical Impression(s) / ED Diagnoses Final diagnoses:  Flu    Rx / DC Orders ED Discharge Orders     None         Hoy Nidia JULIANNA DEVONNA 12/02/23 2229    Darra Fonda MATSU, MD 12/03/23 1222

## 2023-12-02 NOTE — ED Triage Notes (Signed)
 Pt states that one of his coworkers has the flu and today at work he began having chills and body aches.

## 2023-12-02 NOTE — Discharge Instructions (Addendum)

## 2023-12-20 ENCOUNTER — Other Ambulatory Visit: Payer: Self-pay | Admitting: Medical

## 2024-06-22 ENCOUNTER — Other Ambulatory Visit: Payer: Self-pay | Admitting: Medical

## 2024-07-11 ENCOUNTER — Encounter: Payer: Self-pay | Admitting: Medical

## 2024-07-11 ENCOUNTER — Ambulatory Visit: Payer: Self-pay | Admitting: Medical

## 2024-07-11 ENCOUNTER — Ambulatory Visit: Admitting: Medical

## 2024-07-11 VITALS — BP 140/80 | HR 61 | Temp 98.1°F | Resp 15 | Ht 66.0 in | Wt 221.0 lb

## 2024-07-11 DIAGNOSIS — Z0184 Encounter for antibody response examination: Secondary | ICD-10-CM

## 2024-07-11 DIAGNOSIS — Z0000 Encounter for general adult medical examination without abnormal findings: Secondary | ICD-10-CM | POA: Diagnosis not present

## 2024-07-11 DIAGNOSIS — Z125 Encounter for screening for malignant neoplasm of prostate: Secondary | ICD-10-CM | POA: Diagnosis not present

## 2024-07-11 DIAGNOSIS — Z23 Encounter for immunization: Secondary | ICD-10-CM

## 2024-07-11 LAB — CBC WITH DIFFERENTIAL/PLATELET
Basophils Absolute: 0 K/uL (ref 0.0–0.1)
Basophils Relative: 0.6 % (ref 0.0–3.0)
Eosinophils Absolute: 0.3 K/uL (ref 0.0–0.7)
Eosinophils Relative: 5.2 % — ABNORMAL HIGH (ref 0.0–5.0)
HCT: 45.3 % (ref 39.0–52.0)
Hemoglobin: 15.3 g/dL (ref 13.0–17.0)
Lymphocytes Relative: 40.7 % (ref 12.0–46.0)
Lymphs Abs: 2.1 K/uL (ref 0.7–4.0)
MCHC: 33.9 g/dL (ref 30.0–36.0)
MCV: 87.1 fl (ref 78.0–100.0)
Monocytes Absolute: 0.3 K/uL (ref 0.1–1.0)
Monocytes Relative: 6.2 % (ref 3.0–12.0)
Neutro Abs: 2.4 K/uL (ref 1.4–7.7)
Neutrophils Relative %: 47.3 % (ref 43.0–77.0)
Platelets: 246 K/uL (ref 150.0–400.0)
RBC: 5.2 Mil/uL (ref 4.22–5.81)
RDW: 14 % (ref 11.5–15.5)
WBC: 5.1 K/uL (ref 4.0–10.5)

## 2024-07-11 LAB — COMPREHENSIVE METABOLIC PANEL WITH GFR
ALT: 43 U/L (ref 0–53)
AST: 26 U/L (ref 0–37)
Albumin: 4.6 g/dL (ref 3.5–5.2)
Alkaline Phosphatase: 52 U/L (ref 39–117)
BUN: 17 mg/dL (ref 6–23)
CO2: 27 meq/L (ref 19–32)
Calcium: 10 mg/dL (ref 8.4–10.5)
Chloride: 104 meq/L (ref 96–112)
Creatinine, Ser: 1.06 mg/dL (ref 0.40–1.50)
GFR: 80.98 mL/min (ref >60.00)
Glucose, Bld: 93 mg/dL (ref 70–99)
Potassium: 4.5 meq/L (ref 3.5–5.1)
Sodium: 139 meq/L (ref 135–145)
Total Bilirubin: 0.5 mg/dL (ref 0.2–1.2)
Total Protein: 8.1 g/dL (ref 6.0–8.3)

## 2024-07-11 LAB — LIPID PANEL
Cholesterol: 236 mg/dL — ABNORMAL HIGH (ref 0–200)
HDL: 72.1 mg/dL (ref >39.00)
LDL Cholesterol: 135 mg/dL — ABNORMAL HIGH (ref 0–99)
NonHDL: 163.64
Total CHOL/HDL Ratio: 3
Triglycerides: 141 mg/dL (ref 0.0–149.0)
VLDL: 28.2 mg/dL (ref 0.0–40.0)

## 2024-07-11 LAB — PSA: PSA: 1.23 ng/mL (ref 0.10–4.00)

## 2024-07-11 MED ORDER — ATORVASTATIN CALCIUM 10 MG PO TABS: 10.0000 mg | ORAL_TABLET | Freq: Every day | ORAL | 3 refills | Status: AC

## 2024-07-11 NOTE — Progress Notes (Signed)
 Subjective:    Patient ID: Theodore Maldonado, male    DOB: 01-Sep-1971, 53 y.o.   MRN: [id number]  HPI  Pt in for wellness exam.  Pt is fasting presently.   Works at [employer]. Pt exercising some by walking. Eating moderate healthy. Nonsmoker. Drinks alcohol 3 times a week.(1-2 beers spread out). 1 up coffee a day.    Pt got flu vaccine and pcv 20 today.  Screening psa today.  Up to date on colonoscopy.   BP is high today initially but he was late in taking his bp med today. Took about 20-30 minutes before got in room. Pt is on losartan  100 mg daily and amlodipine . 2nd bp check better than on arrival.       Review of Systems  Constitutional:  Negative for chills, fatigue and fever.  HENT:  Negative for congestion and hearing loss.   Respiratory:  Negative for chest tightness, shortness of breath and wheezing.   Cardiovascular:  Negative for chest pain and palpitations.  Gastrointestinal:  Negative for abdominal pain, constipation, nausea and vomiting.  Musculoskeletal:  Negative for back pain, myalgias and neck stiffness.  Skin:  Negative for rash.  Neurological:  Negative for dizziness and light-headedness.  Hematological:  Negative for adenopathy. Does not bruise/bleed easily.  Psychiatric/Behavioral:  Negative for behavioral problems and dysphoric mood.     Past Medical History:  Diagnosis Date   Elbow effusion    R   Frequent headaches    Gout    toe   Hyperlipidemia    on meds   Hypertension    on meds   Migraines      Social History   Socioeconomic History   Marital status: Married    Spouse name: Not on file   Number of children: 2   Years of education: Not on file   Highest education level: Not on file  Occupational History    Employer: [employer]    Comment: Network engineer  Tobacco Use   Smoking status: Never   Smokeless tobacco: Never  Vaping Use   Vaping status: Never Used  Substance and Sexual Activity   Alcohol use: Yes     Alcohol/week: 6.0 standard drinks of alcohol    Types: 6 Standard drinks or equivalent per week   Drug use: No   Sexual activity: Never  Other Topics Concern   Not on file  Social History Narrative   Mr. Procter lives with his sister. He has two daughters. Works [employer].   Social Drivers of Corporate investment banker Strain: Not on file  Food Insecurity: Not on file  Transportation Needs: Not on file  Physical Activity: Not on file  Stress: Not on file  Social Connections: Not on file  Intimate Partner Violence: Not on file    Past Surgical History:  Procedure Laterality Date   TESTICLE TORSION REDUCTION     child    Family History  Problem Relation Age of Onset   Hypertension Mother    Hypertension Sister    Colon polyps Neg Hx    Colon cancer Neg Hx    Esophageal cancer Neg Hx    Stomach cancer Neg Hx    Rectal cancer Neg Hx     No Known Allergies  Current Outpatient Medications on File Prior to Visit  Medication Sig Dispense Refill   amLODipine  (NORVASC ) 5 MG tablet TAKE 1 TABLET (5 MG TOTAL) BY MOUTH DAILY. 90 tablet 1   amoxicillin -clavulanate (  AUGMENTIN ) 875-125 MG tablet Take 1 tablet by mouth 2 (two) times daily. 10 tablet 0   benzonatate  (TESSALON ) 100 MG capsule Take 1 capsule (100 mg total) by mouth 3 (three) times daily as needed for cough. 30 capsule 0   fluticasone  (FLONASE ) 50 MCG/ACT nasal spray SPRAY 2 SPRAYS INTO EACH NOSTRIL EVERY DAY 48 mL 1   losartan  (COZAAR ) 100 MG tablet TAKE 1 TABLET BY MOUTH EVERY DAY 90 tablet 3   Current Facility-Administered Medications on File Prior to Visit  Medication Dose Route Frequency Provider Last Rate Last Admin   0.9 %  sodium chloride  infusion  500 mL Intravenous Once Danis, Victory CROME III, MD        BP (!) 140/80   Pulse 61   Temp 98.1 F (36.7 C) (Oral)   Resp 15   Ht 5' 6 (1.676 m)   Wt 221 lb (100.2 kg)   SpO2 98%   BMI 35.67 kg/m        Objective:   Physical Exam  General Mental Status- Alert.  General Appearance- Not in acute distress.   Skin General: Color- Normal Color. Moisture- Normal Moisture.  Neck Carotid Arteries- Normal color. Moisture- Normal Moisture. No carotid bruits. No JVD.  Chest and Lung Exam Auscultation: Breath Sounds:-CTA  Cardiovascular Auscultation:Rythm- RRR Murmurs & Other Heart Sounds:Auscultation of the heart reveals- No Murmurs.  Abdomen Inspection:-Inspeection Normal. Palpation/Percussion:Note:No mass. Palpation and Percussion of the abdomen reveal- Non Tender, Non Distended + BS, no rebound or guarding.   Neurologic Cranial Nerve exam:- CN III-XII intact(No nystagmus), symmetric smile. Strength:- 5/5 equal and symmetric strength both upper and lower extremities.       Assessment & Plan:   Patient Instructions  For you wellness exam today I have ordered cbc, cmp, psa and lipid panel.  Vaccine fu and pcv 20 today. Shingrix vaccine later date.  Hep b immunity status testing.  Recommend exercise and healthy diet.  We will let you know lab results as they come in.  Follow up date appointment will be determined after lab review.    Htn- bp better controlled on 2nd check but borderline. Check bp 3 times a week over next 2 weeks and update me on those readings. Continue current amlodipine  and losartan .     Dallas Maxwell, PA-C

## 2024-07-11 NOTE — Addendum Note (Signed)
 Addended by: GERARD CHUCKIE SAILOR on: 07/11/2024 02:07 PM   Modules accepted: Orders

## 2024-07-11 NOTE — Patient Instructions (Addendum)
 For you wellness exam today I have ordered cbc, cmp, psa and lipid panel.  Vaccine fu and pcv 20 today. Shingrix vaccine later date.  Hep b immunity status testing.  Recommend exercise and healthy diet.  We will let you know lab results as they come in.  Follow up date appointment will be determined after lab review.      Htn- bp better controlled on 2nd check but borderline. Check bp 3 times a week over next 2 weeks and update me on those readings. Continue current amlodipine  and losartan .Preventive Care 52-25 Years Old, Male Preventive care refers to lifestyle choices and visits with your health care provider that can promote health and wellness. Preventive care visits are also called wellness exams. What can I expect for my preventive care visit? Counseling During your preventive care visit, your health care provider may ask about your: Medical history, including: Past medical problems. Family medical history. Current health, including: Emotional well-being. Home life and relationship well-being. Sexual activity. Lifestyle, including: Alcohol, nicotine or tobacco, and drug use. Access to firearms. Diet, exercise, and sleep habits. Safety issues such as seatbelt and bike helmet use. Sunscreen use. 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 Physical exam Your health care provider will check your: Height and weight. These may be used to calculate your BMI (body mass index). BMI is a measurement that tells if you are at a healthy weight. Waist circumference. This measures the distance around your waistline. This measurement also tells if you are at a healthy weight and may help predict your risk of certain diseases, such as type 2 diabetes and high blood pressure. Heart rate and blood pressure. Body temperature. Skin for abnormal spots. What immunizations do I need?  Vaccines are usually given at various ages, according to a schedule. Your health care provider will recommend vaccines for you  based on your age, medical history, and lifestyle or other factors, such as travel or where you work. What tests do I need? Screening Your health care provider may recommend screening tests for certain conditions. This may include: Lipid and cholesterol levels. Diabetes screening. This is done by checking your blood sugar (glucose) after you have not eaten for a while (fasting). Hepatitis B test. Hepatitis C test. HIV (human immunodeficiency virus) test. STI (sexually transmitted infection) testing, if you are at risk. Lung cancer screening. Prostate cancer screening. Colorectal cancer screening. Talk with your health care provider about your test results, treatment options, and if necessary, the need for more tests. Follow these instructions at home: Eating and drinking  Eat a diet that includes fresh fruits and vegetables, whole grains, lean protein, and low-fat dairy products. Take vitamin and mineral supplements as recommended by your health care provider. Do not drink alcohol if your health care provider tells you not to drink. If you drink alcohol: Limit how much you have to 0-2 drinks a day. Know how much alcohol is in your drink. In the [place] one drink equals one 12 oz bottle of beer (355 mL), one 5 oz glass of wine (148 mL), or one 1 oz glass of hard liquor (44 mL). Lifestyle Brush your teeth every morning and night with fluoride toothpaste. Floss one time each day. Exercise for at least 30 minutes 5 or more days each week. Do not use any products that contain nicotine or tobacco. These products include cigarettes, chewing tobacco, and vaping devices, such as e-cigarettes. If you need help quitting, ask your health care provider. Do not use drugs. If you are  sexually active, practice safe sex. Use a condom or other form of protection to prevent STIs. Take aspirin only as told by your health care provider. Make sure that you understand how much to take and what form to take.  Work with your health care provider to find out whether it is safe and beneficial for you to take aspirin daily. Find healthy ways to manage stress, such as: Meditation, yoga, or listening to music. Journaling. Talking to a trusted person. Spending time with friends and family. Minimize exposure to UV radiation to reduce your risk of skin cancer. Safety Always wear your seat belt while driving or riding in a vehicle. Do not drive: If you have been drinking alcohol. Do not ride with someone who has been drinking. When you are tired or distracted. While texting. If you have been using any mind-altering substances or drugs. Wear a helmet and other protective equipment during sports activities. If you have firearms in your house, make sure you follow all gun safety procedures. What's next? Go to your health care provider once a year for an annual wellness visit. Ask your health care provider how often you should have your eyes and teeth checked. Stay up to date on all vaccines. This information is not intended to replace advice given to you by your health care provider. Make sure you discuss any questions you have with your health care provider. Document Revised: 04/10/2021 Document Reviewed: 04/10/2021 [employer] Patient Education  2024 [employer].

## 2024-07-19 ENCOUNTER — Other Ambulatory Visit: Payer: Self-pay | Admitting: Medical
